# Patient Record
Sex: Female | Born: 1935
Health system: Southern US, Community
[De-identification: ages and names within clinical notes are randomized; demographics above are authoritative.]

## PROBLEM LIST (undated history)

## (undated) DIAGNOSIS — R011 Cardiac murmur, unspecified: Secondary | ICD-10-CM

## (undated) DIAGNOSIS — I10 Essential (primary) hypertension: Secondary | ICD-10-CM

## (undated) DIAGNOSIS — H04123 Dry eye syndrome of bilateral lacrimal glands: Secondary | ICD-10-CM

## (undated) HISTORY — PX: CATARACT EXTRACTION: SUR2

## (undated) HISTORY — DX: Dry eye syndrome of bilateral lacrimal glands: H04.123

## (undated) HISTORY — PX: EYE SURGERY: SHX253

## (undated) HISTORY — DX: Essential (primary) hypertension: I10

## (undated) HISTORY — PX: HERNIA REPAIR: SHX51

## (undated) HISTORY — PX: ABDOMINAL HYSTERECTOMY: SHX81

## (undated) HISTORY — PX: CHOLECYSTECTOMY: SHX55

## (undated) HISTORY — PX: TONSILLECTOMY: SUR1361

---

## 1998-09-03 ENCOUNTER — Other Ambulatory Visit: Admission: RE | Admit: 1998-09-03 | Discharge: 1998-09-03 | Payer: Self-pay | Admitting: Obstetrics and Gynecology

## 1998-11-05 ENCOUNTER — Encounter (INDEPENDENT_AMBULATORY_CARE_PROVIDER_SITE_OTHER): Payer: Self-pay

## 1998-11-05 ENCOUNTER — Other Ambulatory Visit: Admission: RE | Admit: 1998-11-05 | Discharge: 1998-11-05 | Payer: Self-pay | Admitting: Obstetrics and Gynecology

## 1999-01-28 ENCOUNTER — Encounter: Payer: Self-pay | Admitting: General Surgery

## 1999-01-30 ENCOUNTER — Ambulatory Visit (HOSPITAL_COMMUNITY): Admission: RE | Admit: 1999-01-30 | Discharge: 1999-01-31 | Payer: Self-pay | Admitting: General Surgery

## 1999-06-18 ENCOUNTER — Encounter: Payer: Self-pay | Admitting: Family Medicine

## 1999-06-18 ENCOUNTER — Ambulatory Visit (HOSPITAL_COMMUNITY): Admission: RE | Admit: 1999-06-18 | Discharge: 1999-06-18 | Payer: Self-pay | Admitting: Family Medicine

## 1999-11-13 ENCOUNTER — Emergency Department (HOSPITAL_COMMUNITY): Admission: EM | Admit: 1999-11-13 | Discharge: 1999-11-13 | Payer: Self-pay

## 2000-07-21 ENCOUNTER — Other Ambulatory Visit: Admission: RE | Admit: 2000-07-21 | Discharge: 2000-07-21 | Payer: Self-pay | Admitting: Obstetrics and Gynecology

## 2001-12-20 ENCOUNTER — Encounter: Payer: Self-pay | Admitting: Ophthalmology

## 2001-12-23 ENCOUNTER — Ambulatory Visit (HOSPITAL_COMMUNITY): Admission: RE | Admit: 2001-12-23 | Discharge: 2001-12-23 | Payer: Self-pay | Admitting: Ophthalmology

## 2003-08-01 ENCOUNTER — Other Ambulatory Visit: Admission: RE | Admit: 2003-08-01 | Discharge: 2003-08-01 | Payer: Self-pay | Admitting: Obstetrics and Gynecology

## 2003-09-07 ENCOUNTER — Ambulatory Visit (HOSPITAL_COMMUNITY): Admission: RE | Admit: 2003-09-07 | Discharge: 2003-09-07 | Payer: Self-pay | Admitting: Family Medicine

## 2003-09-19 ENCOUNTER — Encounter: Admission: RE | Admit: 2003-09-19 | Discharge: 2003-09-19 | Payer: Self-pay | Admitting: Family Medicine

## 2004-08-22 ENCOUNTER — Encounter: Admission: RE | Admit: 2004-08-22 | Discharge: 2004-08-22 | Payer: Self-pay | Admitting: Obstetrics and Gynecology

## 2004-11-27 ENCOUNTER — Encounter: Admission: RE | Admit: 2004-11-27 | Discharge: 2004-11-27 | Payer: Self-pay | Admitting: General Surgery

## 2004-12-02 ENCOUNTER — Ambulatory Visit (HOSPITAL_BASED_OUTPATIENT_CLINIC_OR_DEPARTMENT_OTHER): Admission: RE | Admit: 2004-12-02 | Discharge: 2004-12-02 | Payer: Self-pay | Admitting: General Surgery

## 2004-12-02 ENCOUNTER — Ambulatory Visit (HOSPITAL_COMMUNITY): Admission: RE | Admit: 2004-12-02 | Discharge: 2004-12-02 | Payer: Self-pay | Admitting: General Surgery

## 2005-10-23 ENCOUNTER — Encounter: Admission: RE | Admit: 2005-10-23 | Discharge: 2005-10-23 | Payer: Self-pay | Admitting: Obstetrics and Gynecology

## 2006-02-11 ENCOUNTER — Encounter: Admission: RE | Admit: 2006-02-11 | Discharge: 2006-02-11 | Payer: Self-pay | Admitting: General Surgery

## 2006-05-04 ENCOUNTER — Encounter: Admission: RE | Admit: 2006-05-04 | Discharge: 2006-05-04 | Payer: Self-pay | Admitting: General Surgery

## 2006-05-07 ENCOUNTER — Ambulatory Visit (HOSPITAL_BASED_OUTPATIENT_CLINIC_OR_DEPARTMENT_OTHER): Admission: RE | Admit: 2006-05-07 | Discharge: 2006-05-07 | Payer: Self-pay | Admitting: General Surgery

## 2006-05-07 ENCOUNTER — Encounter (INDEPENDENT_AMBULATORY_CARE_PROVIDER_SITE_OTHER): Payer: Self-pay | Admitting: Specialist

## 2006-08-12 ENCOUNTER — Other Ambulatory Visit: Admission: RE | Admit: 2006-08-12 | Discharge: 2006-08-12 | Payer: Self-pay | Admitting: Obstetrics and Gynecology

## 2007-02-25 ENCOUNTER — Encounter: Admission: RE | Admit: 2007-02-25 | Discharge: 2007-02-25 | Payer: Self-pay | Admitting: Obstetrics and Gynecology

## 2007-10-25 ENCOUNTER — Encounter: Admission: RE | Admit: 2007-10-25 | Discharge: 2007-10-25 | Payer: Self-pay | Admitting: Gastroenterology

## 2008-05-16 ENCOUNTER — Encounter: Admission: RE | Admit: 2008-05-16 | Discharge: 2008-05-16 | Payer: Self-pay | Admitting: Family Medicine

## 2009-06-05 ENCOUNTER — Encounter: Admission: RE | Admit: 2009-06-05 | Discharge: 2009-06-05 | Payer: Self-pay | Admitting: Family Medicine

## 2010-04-30 ENCOUNTER — Emergency Department (HOSPITAL_COMMUNITY)
Admission: EM | Admit: 2010-04-30 | Discharge: 2010-04-30 | Payer: Self-pay | Source: Home / Self Care | Admitting: Family Medicine

## 2010-05-05 ENCOUNTER — Encounter: Payer: Self-pay | Admitting: Obstetrics and Gynecology

## 2010-06-06 ENCOUNTER — Other Ambulatory Visit: Payer: Self-pay | Admitting: Family Medicine

## 2010-06-06 DIAGNOSIS — Z1231 Encounter for screening mammogram for malignant neoplasm of breast: Secondary | ICD-10-CM

## 2010-06-14 ENCOUNTER — Ambulatory Visit
Admission: RE | Admit: 2010-06-14 | Discharge: 2010-06-14 | Disposition: A | Discharge status: Home / Self Care | Payer: Medicare Other | Source: Ambulatory Visit | Attending: Family Medicine | Admitting: Family Medicine

## 2010-06-14 DIAGNOSIS — Z1231 Encounter for screening mammogram for malignant neoplasm of breast: Secondary | ICD-10-CM

## 2010-07-08 ENCOUNTER — Inpatient Hospital Stay (INDEPENDENT_AMBULATORY_CARE_PROVIDER_SITE_OTHER)
Admission: RE | Admit: 2010-07-08 | Discharge: 2010-07-08 | Disposition: A | Discharge status: Home / Self Care | Payer: Medicare Other | Source: Ambulatory Visit

## 2010-07-08 DIAGNOSIS — R51 Headache: Secondary | ICD-10-CM

## 2010-07-08 DIAGNOSIS — M542 Cervicalgia: Secondary | ICD-10-CM

## 2010-08-30 NOTE — Op Note (Signed)
NAME:  [name] [name]                          ACCOUNT NO.:  [id number]   MEDICAL RECORD NO.:  [id number]                   PATIENT TYPE:  OIB   LOCATION:  2894                                 FACILITY:  [facility]   PHYSICIAN:  Marya Landry. Carlyle Lipa., M.D.      DATE OF BIRTH:  Jul 30, 1931   DATE OF PROCEDURE:  01/14/2002  DATE OF DISCHARGE:  12/23/2001                                 OPERATIVE REPORT   PREOPERATIVE DIAGNOSIS:  Immature cataract, left eye.   POSTOPERATIVE DIAGNOSIS:  Immature cataract, left eye.   OPERATION:  Extracapsular cataract extraction with intraocular lens  implantation.   SURGEON:  Marya Landry. [name], M.D.   ANESTHESIA:  Local using Xylocaine 2% with Marcaine and 0.75% W[place]ase.   JUSTIFICATION FOR PROCEDURE:  This is a 77 year old lady, who complains of  the inability to see from the left eye.  She has been followed for some time  for progressive cataract formation.  Now, the cataract has progressed to a  point that there is no view into the fundus.  She was evaluated and found to  have a visual acuity correctable to 20/30 on the right, counts fingers on  the left.  There was a dense 4+ nuclear sclerotic cataract of the left eye.  Cataract extraction with intraocular lens implantation was recommended.  She  is admitted at this time to undergo the procedure.   DESCRIPTION OF PROCEDURE:  Under influence of IV sedation, Van Lint akinesia  and retrobulbar anesthesia were given.  The patient was prepped and draped  in the usual manner.  The lid speculum was inserted under the upper and  lower lids of the left eye and four silk traction sutures were passed  through the belly of the superior rectus muscle for traction.  A 4 inch  length conjunctival flap was turned and hemostasis achieved with using  cautery.  A groove incision was made around the limbus for 120 degrees.  The  anterior chamber was then entered through this groove incision at the 11:00  o'clock  position using a Superblade.  OcuCoat was injected into the eye and  an anterior capsulotomy was done using a bent 25 gauge needle.  The  corneoscleral wound was then extended first toward the left, then toward the  right, placing a single 8-0 Vicryl suture across each arm of the incision,  as it was made ___________ toward the left and toward the right.  The  nucleus was then manually expressed from the eye.  The two previously placed  sutures were closed and a third one was placed at the 12:00 o'clock  position.  The eye handpiece was passed into the eye and the residual  cortical material was aspirated.  The posterior capsule was polished using  the olive tip polisher.  The wound was widened slightly to accommodate a  posterior chamber lens.  This lens was seated into the eye behind the iris  without difficulty.  The anterior chamber was reformed and pupil was  ___________ using Miochol.  The corneoscleral wound was closed by using a  combination of interrupted sutures of 8-0 Vicryl and 10-0 nylon.  After it  was ascertained that the wound was air tight and water tight, the  conjunctiva was closed using thermal cautery.  1 cc of Celestone and a 0.50  cc of Gentamicin were injected subconjunctivally.  Maxitrol Ophthalmic  ointment and Pilocarpine ointment were applied, along with a patch and Fox  shield.  The patient tolerated the procedure well and was discharged to Post-  Anesthesia Recovery in satisfactory  condition.  She was instructed to rest today, take Vicodin every four hours  as needed for pain and to see me in my office tomorrow for further  evaluation.   DISCHARGE DIAGNOSIS:  Immature cataract, left eye.                                               Marya Landry. Carlyle Lipa., M.D.    TEB/MEDQ  D:  01/14/2002  T:  01/17/2002  Job:  [id number]

## 2010-08-30 NOTE — Op Note (Signed)
NAME:  [name] [name], [name] [name]              ACCOUNT NO.:  [id number]   MEDICAL RECORD NO.:  [id number]          PATIENT TYPE:  AMB   LOCATION:  DSC                          FACILITY:  [facility]   PHYSICIAN:  Cherylynn Ridges, M.D.    DATE OF BIRTH:  15-Feb-1932   DATE OF PROCEDURE:  12/02/2004  DATE OF DISCHARGE:                                 OPERATIVE REPORT   PREOPERATIVE DIAGNOSIS:  Right inguinal hernia.   POSTOPERATIVE DIAGNOSIS:  Right indirect inguinal hernia.   PROCEDURE:  Bassini-type right inguinal hernia repair without mesh.   SURGEON:  Dr. Lindie Spruce.   ASSISTANT:  No assistant.   ANESTHESIA:  General with a laryngeal airway.   ESTIMATED BLOOD LOSS:  Less than 20 cc.   COMPLICATIONS:  None.   CONDITION:  Stable.   INDICATIONS FOR OPERATION:  The patient is a 77 year old female with  symptomatic right inguinal hernia who comes in now for repair.   FINDINGS:  The patient had an indirect hernia associated with lipoma of the  right inguinal area.   OPERATION:  The patient was taken to the operating room and placed on the  table in supine position. After an adequate general laryngeal airway  anesthetic was administered, she was prepped and draped in the usual sterile  manner, exposing the right groin.   The transverse incision about 4-1/2 to 5 cm long was made at the superficial  ring level using a #10 blade and taken down to the external oblique fashion  with cautery. We cauterized the bleeding and the subcutaneous tissue. A  hernia could be seen bulging through the superficial ring, and we opened the  ring through the external oblique fibers along the fibers, exposing the  ligamentum teres. The round ligament was mobilized using a Penrose drain. We  subsequently dissected away the hernia sac which was anterior medially,  opened it to ensure there was no entrapped contents, and ligated it out at  the base after twisting it at the internal ring using 2-0 Ethibond suture  ligatures.  Once these were in place, we actually repaired the floor using  interrupted 0 Ethibond sutures attaching the conjoined tendon down to the  refracted portion of inguinal ligament without much tension. Once this was  done, we closed the external oblique fascia on top of the round ligament  using a running 3-0 Vicryl. Then, subcu Scarpa's fascia was  reapproximated using interrupted 0 Vicryl. Then, the skin was closed using a  running subcuticular suture of 4-0 Monocryl. We irrigated with saline and  injected with 1/2% Marcaine without epinephrine. Sterile dressing was  applied.      Cherylynn Ridges, M.D.  Electronically Signed     JOW/MEDQ  D:  12/02/2004  T:  12/02/2004  Job:  [id number]

## 2010-08-30 NOTE — Op Note (Signed)
NAME:  [name] [name], [name] [name]              ACCOUNT NO.:  [id number]   MEDICAL RECORD NO.:  [id number]          PATIENT TYPE:  AMB   LOCATION:  DSC                          FACILITY:  [facility]   PHYSICIAN:  Cherylynn Ridges, M.D.    DATE OF BIRTH:  [birth year]   DATE OF PROCEDURE:  05/07/2006  DATE OF DISCHARGE:                               OPERATIVE REPORT   PREOPERATIVE DIAGNOSIS:  Left femoral hernia.   POSTOPERATIVE DIAGNOSIS:  Left femoral hernia.   PROCEDURE:  Left femoral hernia repair, Cooper/McVay type without mesh.   SURGEON:  Cherylynn Ridges, M.D.   ANESTHESIA:  General with laryngeal airway.   ESTIMATED BLOOD LOSS:  Less than 20 mL.   COMPLICATIONS:  None.   CONDITION:  Stable.   SPECIMEN:  Left inguinal lymph node was sent.   INDICATIONS FOR OPERATION:  The patient is a 77 year old with a  symptomatic left femoral hernia noted on CT scan and now comes in for  repair.   FINDINGS:  The patient had a small direct femoral defect, which was  going down just medial to the femoral vein on the left side.   OPERATION:  The patient was taken to the operating room and placed on  the table in the supine position.  After an adequate general laryngeal  anesthetic was administered, she was prepped and draped in the usual  sterile manner exposing the left inguinal area.   The patient had had a previous left inguinal hernia repair.  A left  transverse curvilinear incision was made about 5 cm long down into  subcutaneous tissue.  We then dissected down through Scarpa fascia down  to the external oblique fascia, which had some scarring around it.  However, we were able to identify the inferior fascial edge distal to  what would have been a superficial ring.  We opened the fascia down  through the superficial ring and then identified the edge of the  conjoined tendon.  However, no round ligament was noted on the left  side.  There was a branch of the iliohypogastric nerve which went  directly into the area of repair and we transected it at near what would  be the deep ring.  This was done with cautery with no ligatures in that  area.   We identified the left femoral hernia going down medial to the left  femoral vein.  We were able to dissect it free and then subsequently  imbricated to itself using the transversalis fascia and 0 Ethibond  sutures.  Once this was done, a Cooper/McVay type repair was done and  taking the conjoined tendon down to the Cooper ligament using  interrupted 0 Ethibond sutures.  We stopped this at the place just  medial to the left femoral vein and made a transition stitch to the  reflective portion of the inguinal ligament using the same 0 Ethibond  suture.  Lateral stitches were placed, but care was taken not to impinge  upon the left femoral vein.   Once this was completed, we irrigated with saline solution.  We closed  the external  oblique fascia using running 3-0 Vicryl and then Scarpa  fascia was reapproximated using interrupted 3-0 Vicryl.  Then we  injected 20 mL of 0.5% Marcaine without epinephrine into the  subcutaneous with a block at the anterior superior iliac spine.  The  skin was closed using running subcuticular stitch of 4-0 Monocryl.  A  sterile dressing was applied.  All counts were correct, including  needle, sponges, and instruments.      Cherylynn Ridges, M.D.  Electronically Signed     JOW/MEDQ  D:  05/07/2006  T:  05/07/2006  Job:  [id number]   cc:   Primary Care Physician

## 2010-10-07 ENCOUNTER — Other Ambulatory Visit: Payer: Self-pay | Admitting: Family Medicine

## 2010-10-07 DIAGNOSIS — K862 Cyst of pancreas: Secondary | ICD-10-CM

## 2010-10-10 ENCOUNTER — Ambulatory Visit
Admission: RE | Admit: 2010-10-10 | Discharge: 2010-10-10 | Disposition: A | Discharge status: Home / Self Care | Payer: Medicare Other | Source: Ambulatory Visit | Attending: Family Medicine | Admitting: Family Medicine

## 2010-10-10 DIAGNOSIS — K862 Cyst of pancreas: Secondary | ICD-10-CM

## 2010-10-10 DIAGNOSIS — K863 Pseudocyst of pancreas: Secondary | ICD-10-CM

## 2010-10-10 MED ORDER — IOHEXOL 300 MG/ML  SOLN: 100.0000 mL | Freq: Once | INTRAMUSCULAR | Status: AC | PRN

## 2011-09-19 ENCOUNTER — Other Ambulatory Visit: Payer: Self-pay | Admitting: Family Medicine

## 2011-10-09 ENCOUNTER — Other Ambulatory Visit: Payer: Self-pay | Admitting: Family Medicine

## 2011-10-09 DIAGNOSIS — Z1231 Encounter for screening mammogram for malignant neoplasm of breast: Secondary | ICD-10-CM

## 2011-10-10 ENCOUNTER — Ambulatory Visit
Admission: RE | Admit: 2011-10-10 | Discharge: 2011-10-10 | Disposition: A | Discharge status: Home / Self Care | Payer: Medicare Other | Source: Ambulatory Visit | Attending: Family Medicine | Admitting: Family Medicine

## 2011-10-10 DIAGNOSIS — Z1231 Encounter for screening mammogram for malignant neoplasm of breast: Secondary | ICD-10-CM

## 2013-04-01 ENCOUNTER — Other Ambulatory Visit: Payer: Self-pay

## 2013-04-01 DIAGNOSIS — Z1231 Encounter for screening mammogram for malignant neoplasm of breast: Secondary | ICD-10-CM

## 2013-04-22 ENCOUNTER — Ambulatory Visit: Payer: Self-pay

## 2013-05-03 ENCOUNTER — Ambulatory Visit (INDEPENDENT_AMBULATORY_CARE_PROVIDER_SITE_OTHER): Payer: Medicare HMO

## 2013-05-03 DIAGNOSIS — M79673 Pain in unspecified foot: Secondary | ICD-10-CM

## 2013-05-03 DIAGNOSIS — B351 Tinea unguium: Secondary | ICD-10-CM

## 2013-05-03 DIAGNOSIS — M79609 Pain in unspecified limb: Secondary | ICD-10-CM

## 2013-05-03 DIAGNOSIS — Q828 Other specified congenital malformations of skin: Secondary | ICD-10-CM

## 2013-05-03 DIAGNOSIS — M1990 Unspecified osteoarthritis, unspecified site: Secondary | ICD-10-CM

## 2013-05-03 DIAGNOSIS — Q6650 Congenital pes planus, unspecified foot: Secondary | ICD-10-CM

## 2013-05-03 NOTE — Progress Notes (Signed)
   Subjective:    Patient ID: [name] [name], female    DOB: 06/20/1931, 80 y.o.   MRN: [id number]  HPI Comments: "I have flat feet"  Patient states that she has had flat feet for several years. She has callused areas that have become painful when walking. She uses Vaseline and gets pedicure to shave monthly. Getting worse and uncomfortable.  patient has severe arthropathy associated with pes planus/has valgus deformity collapse of the medial arch left more so than right with Charcot like changes of both feet. His plantar prominence of bone structures of the first met cuneiform joint plantarly on bilateral feet with associated keratoses. Patient been doing palliative care in the past with temporary relief is wearing depth shoes with Plastizote inlays to accommodate lesions.    Review of Systems  Musculoskeletal: Positive for gait problem.  All other systems reviewed and are negative.       Objective:   Physical Exam Vascular status is intact as follows pedal pulses palpable DP postal for PT thready one over 4 bilateral Refill time 3 seconds all digits. Neurologically epicritic and proprioceptive sensations intact there is normal plantar response DTRs not elicited dermatologically nail somewhat criptotic incurvated and friable tender on palpation and with enclosed shoe wear one through 5 bilateral. There is also multiple keratoses sub-first met cuneiform base bilateral secondary to collapse in the medial column bilateral arches there is also keratoses HD 5 bilateral interdigitally between the first and second digits right foot to hammertoe and HAV deformity. X-rays demonstrate severe arthropathy Lisfranc's and mid tarsus area left foot more so than right with someone Charcot like changes on the left foot. We discussed options patient candidate for surgical intervention based on age in symptomology and osteopenic changes at this time. Recommendation is for accommodative therapy with new Plastizote  inlays being dispensed at this time. The fit and contour well to the patient's foot while for break-in period and adjustments over time. Maintain accommodative shoes she is currently wearing.       Assessment & Plan:  Assessment severe arthropathy with Charcot like changes of the feet left more so than right will maintain accommodative therapy with Plastizote accommodative cushioned insoles memory foam like insoles. These are dispensed for patient use at this time. Also debridement nails painful mycotic friable nails 1 through 5 bilateral and debridement multiple keratoses HD 5 bilateral sub-first metatarsal base bilateral and interdigitally between first and second right. Maintain cushion shoes and insoles followup in the future and as-needed basis for palliative care is needed contact us visiting changes or exacerbations in the interim.  Harriet Masson DPM

## 2013-05-03 NOTE — Patient Instructions (Signed)

## 2013-05-05 ENCOUNTER — Ambulatory Visit
Admission: RE | Admit: 2013-05-05 | Discharge: 2013-05-05 | Disposition: A | Discharge status: Home / Self Care | Payer: Commercial Managed Care - HMO | Source: Ambulatory Visit

## 2013-05-05 DIAGNOSIS — Z1231 Encounter for screening mammogram for malignant neoplasm of breast: Secondary | ICD-10-CM

## 2013-05-06 ENCOUNTER — Other Ambulatory Visit: Payer: Self-pay | Admitting: Family Medicine

## 2013-05-06 DIAGNOSIS — R928 Other abnormal and inconclusive findings on diagnostic imaging of breast: Secondary | ICD-10-CM

## 2013-05-17 ENCOUNTER — Other Ambulatory Visit: Payer: Self-pay | Admitting: Family Medicine

## 2013-05-17 ENCOUNTER — Ambulatory Visit
Admission: RE | Admit: 2013-05-17 | Discharge: 2013-05-17 | Disposition: A | Discharge status: Home / Self Care | Payer: Commercial Managed Care - HMO | Source: Ambulatory Visit | Attending: Family Medicine | Admitting: Family Medicine

## 2013-05-17 DIAGNOSIS — R928 Other abnormal and inconclusive findings on diagnostic imaging of breast: Secondary | ICD-10-CM

## 2013-05-20 ENCOUNTER — Ambulatory Visit
Admission: RE | Admit: 2013-05-20 | Discharge: 2013-05-20 | Disposition: A | Discharge status: Home / Self Care | Payer: Commercial Managed Care - HMO | Source: Ambulatory Visit | Attending: Family Medicine | Admitting: Family Medicine

## 2013-05-20 ENCOUNTER — Other Ambulatory Visit: Payer: Self-pay | Admitting: Family Medicine

## 2013-05-20 DIAGNOSIS — R921 Mammographic calcification found on diagnostic imaging of breast: Secondary | ICD-10-CM

## 2013-05-20 DIAGNOSIS — R928 Other abnormal and inconclusive findings on diagnostic imaging of breast: Secondary | ICD-10-CM

## 2013-05-20 HISTORY — PX: BREAST BIOPSY: SHX20

## 2013-12-27 ENCOUNTER — Ambulatory Visit (INDEPENDENT_AMBULATORY_CARE_PROVIDER_SITE_OTHER): Payer: Medicare HMO

## 2013-12-27 DIAGNOSIS — B351 Tinea unguium: Secondary | ICD-10-CM

## 2013-12-27 DIAGNOSIS — M19079 Primary osteoarthritis, unspecified ankle and foot: Secondary | ICD-10-CM

## 2013-12-27 DIAGNOSIS — Q828 Other specified congenital malformations of skin: Secondary | ICD-10-CM

## 2013-12-27 DIAGNOSIS — M79609 Pain in unspecified limb: Secondary | ICD-10-CM

## 2013-12-27 DIAGNOSIS — M79673 Pain in unspecified foot: Secondary | ICD-10-CM

## 2013-12-27 NOTE — Patient Instructions (Signed)

## 2013-12-27 NOTE — Progress Notes (Signed)
   Subjective:    Patient ID: [name] [name], female    DOB: June 09, 1931, 80 y.o.   MRN: [id number]  HPI Comments: Pt presents for debridement of 10 toenails and callouses to the medial plantar midfoot.     Review of Systems no new findings or systemic changes no     Objective:   Physical Exam  80 year old female to this time for followup continues have multiple callus series of both feet Below to the medial navicular cuneiform joint the bilateral foot also sub-first MTP area right in HD 5 right. Patient does have thick brittle dystrophic criptotic nails with discoloration and friability 1 through 4 bilateral patient also has some initial maceration fourth webspace left foot which is treated with Candee Furbish paint. Neurovascular status is intact and unchanged pedal pulses are palpable DP +2/4 PT one over 4 bilateral capillary refill time 3 seconds epicritic and proprioceptive sensations intact and symmetric orthopedic exam reveals arthritic changes of the ankle subtalar mid tarsus with continued Charcot like changes of the foot with collapse of the medial arch of the naviculocuneiform and talonavicular joint lateral deviation of the forefoot no current open wounds or ulcers are noted no secondary infection     Assessment & Plan:  assessment this time is onychomycosis painful mycotic nails 1 through 4 bilateral debridement at this time also multiple keratoses debridement presence of osteo- arthropathy and deformity with Charcot like changes of the foot keratosis of first met cuneiform base as well as HD 5 right are all debrided at this time multiple nails and keratoses are debrided at today's visit recheck in 3-6 months for an as-needed basis in the future for followup following debridement Neosporin and looking applied to the fifth toe right maintain appropriate accommodative shoes at all times  [name] [name] DPM

## 2013-12-30 ENCOUNTER — Ambulatory Visit (HOSPITAL_COMMUNITY): Payer: Medicare HMO | Attending: Physician Assistant

## 2013-12-30 ENCOUNTER — Emergency Department (HOSPITAL_COMMUNITY)
Admission: EM | Admit: 2013-12-30 | Discharge: 2013-12-30 | Disposition: A | Discharge status: Home / Self Care | Payer: Medicare HMO | Source: Home / Self Care | Attending: Family Medicine | Admitting: Family Medicine

## 2013-12-30 ENCOUNTER — Encounter (HOSPITAL_COMMUNITY): Payer: Self-pay | Admitting: Emergency Medicine

## 2013-12-30 DIAGNOSIS — M25559 Pain in unspecified hip: Secondary | ICD-10-CM | POA: Diagnosis present

## 2013-12-30 DIAGNOSIS — M5430 Sciatica, unspecified side: Secondary | ICD-10-CM

## 2013-12-30 DIAGNOSIS — M5431 Sciatica, right side: Secondary | ICD-10-CM

## 2013-12-30 LAB — POCT URINALYSIS DIP (DEVICE)
BILIRUBIN URINE: NEGATIVE
Glucose, UA: NEGATIVE mg/dL
Hgb urine dipstick: NEGATIVE
Ketones, ur: NEGATIVE mg/dL
Nitrite: NEGATIVE
PH: 7 (ref 5.0–8.0)
PROTEIN: NEGATIVE mg/dL
SPECIFIC GRAVITY, URINE: 1.01 (ref 1.005–1.030)
Urobilinogen, UA: 0.2 mg/dL (ref 0.0–1.0)

## 2013-12-30 MED ORDER — MELOXICAM 7.5 MG PO TABS: 7.5000 mg | ORAL_TABLET | Freq: Every day | ORAL | Status: DC

## 2013-12-30 NOTE — ED Provider Notes (Signed)
CSN: [id number]     Arrival date & time 12/30/13  1211 History   First MD Initiated Contact with Patient 12/30/13 1258     Chief Complaint  Patient presents with  . Hip Pain   (Consider location/radiation/quality/duration/timing/severity/associated sxs/prior Treatment) HPI Comments: Patient presents with 3 weeks of right hip pain. Cannot recall any specific injury and she does report some relief with the use of tylenol. Reports herself to be otherwise healthy. Denies previous injury or surgery. Patient reports that she has been told that she has OA in several of her other joints and wonders if this is also the case with her hip. Reports that she took a tylenol prior to arrival and she is feeling well at the time of exam and is without symptom.  PCP: Dr. Massie Maroon   Patient is a 80 y.o. female presenting with hip pain. The history is provided by the patient.  Hip Pain This is a new problem. Episode onset: began 3 weeks ago.    Past Medical History  Diagnosis Date  . Dry eyes   . Hypertension    Past Surgical History  Procedure Laterality Date  . Abdominal hysterectomy    . Tonsillectomy    . Hernia repair    . Eye surgery    . Cholecystectomy    . Cataract extraction     No family history on file. History  Substance Use Topics  . Smoking status: Never Smoker   . Smokeless tobacco: Not on file  . Alcohol Use: No   OB History   Grav Para Term Preterm Abortions TAB SAB Ect Mult Living                 Review of Systems  All other systems reviewed and are negative.   Allergies  Actonel  Home Medications   Prior to Admission medications   Medication Sig Start Date End Date Taking? Authorizing Provider  Artificial Tear Ointment (ARTIFICIAL TEARS) ointment as needed.    Historical Provider, MD  Cholecalciferol (VITAMIN D PO) Take by mouth.    Historical Provider, MD  hydrochlorothiazide (MICROZIDE) 12.5 MG capsule Take 12.5 mg by mouth daily.    Historical Provider, MD   meloxicam (MOBIC) 7.5 MG tablet Take 1 tablet (7.5 mg total) by mouth daily. Take once a day as needed for hip pain. 12/30/13   Mathis Fare Mahoganie Basher, PA  PREDNISONE PO by Other route. Eyedrops-left eye    Historical Provider, MD  psyllium (METAMUCIL) 58.6 % packet Take 1 packet by mouth daily.    Historical Provider, MD   BP 159/80  Pulse 74  Temp(Src) 98 F (36.7 C) (Oral)  Resp 16  SpO2 96% Physical Exam  Nursing note and vitals reviewed. Constitutional: She is oriented to person, place, and time. She appears well-developed and well-nourished. No distress.  HENT:  Head: Normocephalic and atraumatic.  Eyes: Conjunctivae are normal.  Cardiovascular: Normal rate, regular rhythm and normal heart sounds.   Pulmonary/Chest: Effort normal and breath sounds normal.  Abdominal: Soft. Bowel sounds are normal. She exhibits no distension. There is no tenderness.  Musculoskeletal: Normal range of motion.       Right hip: She exhibits normal range of motion, normal strength, no tenderness, no bony tenderness, no swelling, no crepitus, no deformity and no laceration.       Legs: Outlined areas are areas of right hip discomfort when symptoms occur.   Neurological: She is alert and oriented to person, place, and time.  Skin: Skin is warm and dry.  Psychiatric: She has a normal mood and affect. Her behavior is normal.    ED Course  Procedures (including critical care time) Labs Review Labs Reviewed  POCT URINALYSIS DIP (DEVICE) - Abnormal; Notable for the following:    Leukocytes, UA SMALL (*)    All other components within normal limits    Imaging Review Dg Hip Complete Right  12/30/2013   CLINICAL DATA:  Pain  EXAM: RIGHT HIP - COMPLETE 2+ VIEW  COMPARISON:  None.  FINDINGS: Frontal pelvis as well as frontal and lateral right hip images were obtained. There is no fracture or dislocation. There is slight symmetric narrowing of both hip joints. No erosive change.  IMPRESSION: Slight  symmetric narrowing of both hip joints. No fracture or dislocation.   Electronically Signed   By: Bretta Bang M.D.   On: 12/30/2013 14:30     MDM   1. Sciatica, right    UA unremarkable Films only notable for symmetric narrowing of both hip joints. I suspect this patient is having mild intermittent sciatica and perhaps mild discomfort from degenerative changes at hips. Will provide patient with Rx for Mobic 7.5mg  to be used once daily as needed for hip discomfort and advise PCP follow up if symptoms persist.     Ria Clock, PA 12/30/13 [phone]

## 2013-12-30 NOTE — ED Provider Notes (Signed)
Medical screening examination/treatment/procedure(s) were performed by resident physician or non-physician practitioner and as supervising physician I was immediately available for consultation/collaboration.   Michele Judy DOUGLAS MD.   Harris Kistler D Sahra Converse, MD 12/30/13 1651 

## 2013-12-30 NOTE — Discharge Instructions (Signed)
The xrays of your right hip and pelvis were normal and only with mild degenerative changes in both hip joints. Your urine studies were normal. I suspect this is simply mild arthritis type pain and I have prescribed a medication that you can use once a day on the days that your are having discomfort. If symptom do not improve or become worse, please follow up with your primary care doctor.  Sciatica Sciatica is pain, weakness, numbness, or tingling along the path of the sciatic nerve. The nerve starts in the lower back and runs down the back of each leg. The nerve controls the muscles in the lower leg and in the back of the knee, while also providing sensation to the back of the thigh, lower leg, and the sole of your foot. Sciatica is a symptom of another medical condition. For instance, nerve damage or certain conditions, such as a herniated disk or bone spur on the spine, pinch or put pressure on the sciatic nerve. This causes the pain, weakness, or other sensations normally associated with sciatica. Generally, sciatica only affects one side of the body. CAUSES   Herniated or slipped disc.  Degenerative disk disease.  A pain disorder involving the narrow muscle in the buttocks (piriformis syndrome).  Pelvic injury or fracture.  Pregnancy.  Tumor (rare). SYMPTOMS  Symptoms can vary from mild to very severe. The symptoms usually travel from the low back to the buttocks and down the back of the leg. Symptoms can include:  Mild tingling or dull aches in the lower back, leg, or hip.  Numbness in the back of the calf or sole of the foot.  Burning sensations in the lower back, leg, or hip.  Sharp pains in the lower back, leg, or hip.  Leg weakness.  Severe back pain inhibiting movement. These symptoms may get worse with coughing, sneezing, laughing, or prolonged sitting or standing. Also, being overweight may worsen symptoms. DIAGNOSIS  Your caregiver will perform a physical exam to look  for common symptoms of sciatica. He or she may ask you to do certain movements or activities that would trigger sciatic nerve pain. Other tests may be performed to find the cause of the sciatica. These may include:  Blood tests.  X-rays.  Imaging tests, such as an MRI or CT scan. TREATMENT  Treatment is directed at the cause of the sciatic pain. Sometimes, treatment is not necessary and the pain and discomfort goes away on its own. If treatment is needed, your caregiver may suggest:  Over-the-counter medicines to relieve pain.  Prescription medicines, such as anti-inflammatory medicine, muscle relaxants, or narcotics.  Applying heat or ice to the painful area.  Steroid injections to lessen pain, irritation, and inflammation around the nerve.  Reducing activity during periods of pain.  Exercising and stretching to strengthen your abdomen and improve flexibility of your spine. Your caregiver may suggest losing weight if the extra weight makes the back pain worse.  Physical therapy.  Surgery to eliminate what is pressing or pinching the nerve, such as a bone spur or part of a herniated disk. HOME CARE INSTRUCTIONS   Only take over-the-counter or prescription medicines for pain or discomfort as directed by your caregiver.  Apply ice to the affected area for 20 minutes, 3-4 times a day for the first 48-72 hours. Then try heat in the same way.  Exercise, stretch, or perform your usual activities if these do not aggravate your pain.  Attend physical therapy sessions as directed by your  caregiver.  Keep all follow-up appointments as directed by your caregiver.  Do not wear high heels or shoes that do not provide proper support.  Check your mattress to see if it is too soft. A firm mattress may lessen your pain and discomfort. SEEK IMMEDIATE MEDICAL CARE IF:   You lose control of your bowel or bladder (incontinence).  You have increasing weakness in the lower back, pelvis,  buttocks, or legs.  You have redness or swelling of your back.  You have a burning sensation when you urinate.  You have pain that gets worse when you lie down or awakens you at night.  Your pain is worse than you have experienced in the past.  Your pain is lasting longer than 4 weeks.  You are suddenly losing weight without reason. MAKE SURE YOU:  Understand these instructions.  Will watch your condition.  Will get help right away if you are not doing well or get worse. Document Released: 03/25/2001 Document Revised: 09/30/2011 Document Reviewed: 08/10/2011 [facility] Patient Information 2015 [place], [place]. This information is not intended to replace advice given to you by your health care provider. Make sure you discuss any questions you have with your health care provider.

## 2013-12-30 NOTE — ED Notes (Signed)
Patient c/o right side hip pain x 3 weeks. Patient reports last week she had bilateral hip pain which caused her to fall. Patient reports she has taken Tylenol with mild relief. Patient is alert and oriented and in no acute distress.

## 2014-01-06 ENCOUNTER — Other Ambulatory Visit: Payer: Self-pay | Admitting: Family Medicine

## 2014-01-06 DIAGNOSIS — H3401 Transient retinal artery occlusion, right eye: Secondary | ICD-10-CM

## 2014-01-12 ENCOUNTER — Ambulatory Visit
Admission: RE | Admit: 2014-01-12 | Discharge: 2014-01-12 | Disposition: A | Discharge status: Home / Self Care | Payer: Commercial Managed Care - HMO | Source: Ambulatory Visit | Attending: Family Medicine | Admitting: Family Medicine

## 2014-01-12 DIAGNOSIS — H3401 Transient retinal artery occlusion, right eye: Secondary | ICD-10-CM

## 2014-04-15 DIAGNOSIS — H2511 Age-related nuclear cataract, right eye: Secondary | ICD-10-CM | POA: Diagnosis not present

## 2014-04-15 DIAGNOSIS — H31012 Macula scars of posterior pole (postinflammatory) (post-traumatic), left eye: Secondary | ICD-10-CM | POA: Diagnosis not present

## 2014-04-28 ENCOUNTER — Other Ambulatory Visit: Payer: Self-pay

## 2014-04-28 DIAGNOSIS — Z1231 Encounter for screening mammogram for malignant neoplasm of breast: Secondary | ICD-10-CM

## 2014-05-08 ENCOUNTER — Ambulatory Visit
Admission: RE | Admit: 2014-05-08 | Discharge: 2014-05-08 | Disposition: A | Discharge status: Home / Self Care | Payer: Commercial Managed Care - HMO | Source: Ambulatory Visit

## 2014-05-08 DIAGNOSIS — Z1231 Encounter for screening mammogram for malignant neoplasm of breast: Secondary | ICD-10-CM

## 2014-06-22 DIAGNOSIS — G453 Amaurosis fugax: Secondary | ICD-10-CM | POA: Diagnosis not present

## 2014-06-22 DIAGNOSIS — H26492 Other secondary cataract, left eye: Secondary | ICD-10-CM | POA: Diagnosis not present

## 2014-06-22 DIAGNOSIS — H2511 Age-related nuclear cataract, right eye: Secondary | ICD-10-CM | POA: Diagnosis not present

## 2014-06-22 DIAGNOSIS — H25011 Cortical age-related cataract, right eye: Secondary | ICD-10-CM | POA: Diagnosis not present

## 2014-07-06 ENCOUNTER — Ambulatory Visit: Payer: Commercial Managed Care - HMO

## 2014-07-10 DIAGNOSIS — M316 Other giant cell arteritis: Secondary | ICD-10-CM | POA: Diagnosis not present

## 2014-07-10 DIAGNOSIS — H5712 Ocular pain, left eye: Secondary | ICD-10-CM | POA: Diagnosis not present

## 2014-07-17 DIAGNOSIS — H5712 Ocular pain, left eye: Secondary | ICD-10-CM | POA: Diagnosis not present

## 2014-07-31 DIAGNOSIS — G453 Amaurosis fugax: Secondary | ICD-10-CM | POA: Diagnosis not present

## 2014-08-15 DIAGNOSIS — Z0000 Encounter for general adult medical examination without abnormal findings: Secondary | ICD-10-CM | POA: Diagnosis not present

## 2014-08-15 DIAGNOSIS — I10 Essential (primary) hypertension: Secondary | ICD-10-CM | POA: Diagnosis not present

## 2014-08-15 DIAGNOSIS — K219 Gastro-esophageal reflux disease without esophagitis: Secondary | ICD-10-CM | POA: Diagnosis not present

## 2014-08-15 DIAGNOSIS — M810 Age-related osteoporosis without current pathological fracture: Secondary | ICD-10-CM | POA: Diagnosis not present

## 2014-08-22 DIAGNOSIS — H2511 Age-related nuclear cataract, right eye: Secondary | ICD-10-CM | POA: Diagnosis not present

## 2014-08-24 DIAGNOSIS — Z1211 Encounter for screening for malignant neoplasm of colon: Secondary | ICD-10-CM | POA: Diagnosis not present

## 2014-12-28 DIAGNOSIS — H02053 Trichiasis without entropian right eye, unspecified eyelid: Secondary | ICD-10-CM | POA: Diagnosis not present

## 2014-12-28 DIAGNOSIS — H40023 Open angle with borderline findings, high risk, bilateral: Secondary | ICD-10-CM | POA: Diagnosis not present

## 2014-12-30 ENCOUNTER — Encounter (HOSPITAL_COMMUNITY): Payer: Self-pay | Admitting: *Deleted

## 2014-12-30 ENCOUNTER — Emergency Department (HOSPITAL_COMMUNITY)
Admission: EM | Admit: 2014-12-30 | Discharge: 2014-12-30 | Disposition: A | Discharge status: Home / Self Care | Payer: Commercial Managed Care - HMO | Attending: Emergency Medicine | Admitting: Emergency Medicine

## 2014-12-30 DIAGNOSIS — M791 Myalgia: Secondary | ICD-10-CM | POA: Diagnosis not present

## 2014-12-30 DIAGNOSIS — Y929 Unspecified place or not applicable: Secondary | ICD-10-CM | POA: Diagnosis not present

## 2014-12-30 DIAGNOSIS — I10 Essential (primary) hypertension: Secondary | ICD-10-CM | POA: Diagnosis not present

## 2014-12-30 DIAGNOSIS — Y939 Activity, unspecified: Secondary | ICD-10-CM | POA: Diagnosis not present

## 2014-12-30 DIAGNOSIS — Y999 Unspecified external cause status: Secondary | ICD-10-CM | POA: Insufficient documentation

## 2014-12-30 DIAGNOSIS — S61215A Laceration without foreign body of left ring finger without damage to nail, initial encounter: Secondary | ICD-10-CM | POA: Diagnosis not present

## 2014-12-30 DIAGNOSIS — Z79899 Other long term (current) drug therapy: Secondary | ICD-10-CM | POA: Diagnosis not present

## 2014-12-30 DIAGNOSIS — W2209XA Striking against other stationary object, initial encounter: Secondary | ICD-10-CM | POA: Diagnosis not present

## 2014-12-30 DIAGNOSIS — Z8669 Personal history of other diseases of the nervous system and sense organs: Secondary | ICD-10-CM | POA: Diagnosis not present

## 2014-12-30 DIAGNOSIS — S61219A Laceration without foreign body of unspecified finger without damage to nail, initial encounter: Secondary | ICD-10-CM

## 2014-12-30 DIAGNOSIS — Z7952 Long term (current) use of systemic steroids: Secondary | ICD-10-CM | POA: Diagnosis not present

## 2014-12-30 DIAGNOSIS — S6992XA Unspecified injury of left wrist, hand and finger(s), initial encounter: Secondary | ICD-10-CM | POA: Diagnosis present

## 2014-12-30 NOTE — ED Notes (Signed)
Pt closed LT ring finger in a amp.and now has swelling and bruising to LT ring finger

## 2014-12-30 NOTE — ED Provider Notes (Signed)
CSN: [id number]     Arrival date & time 12/30/14  0741 History   First MD Initiated Contact with Patient 12/30/14 0750     Chief Complaint  Patient presents with  . Finger Injury     (Consider location/radiation/quality/duration/timing/severity/associated sxs/prior Treatment) HPI Comments: Patient presents with complaint of left ring finger injury sustained an approximately 7 PM yesterday (see 13 hours ago). Patient states that she "smashed" her finger on an amplifier. She complains of swelling and pain in the left finger. She has a ring that she did not initially take off because she did not think that her finger would swell. Upon awaking this morning, patient noted that she could not remove her ring. Patient applied pressure and cleaned the wound with soap and water after the injury. No other treatments prior to arrival. No other medical complaints.  The history is provided by the patient.    Past Medical History  Diagnosis Date  . Dry eyes   . Hypertension    Past Surgical History  Procedure Laterality Date  . Abdominal hysterectomy    . Tonsillectomy    . Hernia repair    . Eye surgery    . Cholecystectomy    . Cataract extraction     History reviewed. No pertinent family history. Social History  Substance Use Topics  . Smoking status: Never Smoker   . Smokeless tobacco: None  . Alcohol Use: No   OB History    No data available     Review of Systems  Constitutional: Negative for activity change.  Musculoskeletal: Positive for myalgias. Negative for back pain, joint swelling, arthralgias and neck pain.  Skin: Positive for wound.  Neurological: Negative for weakness and numbness.    Allergies  Actonel  Home Medications   Prior to Admission medications   Medication Sig Start Date End Date Taking? Authorizing Provider  Artificial Tear Ointment (ARTIFICIAL TEARS) ointment as needed.    Historical Provider, MD  Cholecalciferol (VITAMIN D PO) Take by mouth.     Historical Provider, MD  hydrochlorothiazide (MICROZIDE) 12.5 MG capsule Take 12.5 mg by mouth daily.    Historical Provider, MD  meloxicam (MOBIC) 7.5 MG tablet Take 1 tablet (7.5 mg total) by mouth daily. Take once a day as needed for hip pain. 12/30/13   Mathis Fare Presson, PA  PREDNISONE PO by Other route. Eyedrops-left eye    Historical Provider, MD  psyllium (METAMUCIL) 58.6 % packet Take 1 packet by mouth daily.    Historical Provider, MD   BP 127/72 mmHg  Pulse 69  Temp(Src) 97.7 F (36.5 C) (Oral)  Resp 18  Ht 5\' 3"  (1.6 m)  Wt 123 lb (55.792 kg)  BMI 21.79 kg/m2  SpO2 99%   Physical Exam  Constitutional: She appears well-developed and well-nourished.  HENT:  Head: Normocephalic and atraumatic.  Eyes: Pupils are equal, round, and reactive to light.  Neck: Normal range of motion. Neck supple.  Cardiovascular: Exam reveals no decreased pulses.   Musculoskeletal: She exhibits edema and tenderness.       Left wrist: Normal.       Left hand: She exhibits tenderness. She exhibits normal range of motion and no bony tenderness. Normal sensation noted. Normal strength noted.       Hands: Neurological: She is alert. No sensory deficit.  Motor, sensation, and vascular distal to the injury is fully intact.   Skin: Skin is warm and dry.  Psychiatric: She has a normal mood and affect.  Nursing note and vitals reviewed.   ED Course  Procedures (including critical care time) Labs Review Labs Reviewed - No data to display  Imaging Review No results found. I have personally reviewed and evaluated these images and lab results as part of my medical decision-making.   EKG Interpretation None       7:58 AM Patient seen and examined.   Vital signs reviewed and are as follows: BP 127/72 mmHg  Pulse 69  Temp(Src) 97.7 F (36.5 C) (Oral)  Resp 18  Ht  (1.6 m)  Wt 123 lb (55.792 kg)  BMI 21.79 kg/m2  SpO2 99%  Patient was able to remove the ring after lubrication  applied.  Patient counseled on wound care. Patient was urged to return to the Emergency Department urgently with worsening pain, swelling, expanding erythema especially if it streaks away from the affected area, fever, or if they have any other concerns. Patient verbalized understanding.   Patient believes that she is up-to-date on her tetanus. She is going to review her records at home and contact her PCP if tetanus is outstanding.   MDM   Final diagnoses:  Laceration of finger, initial encounter   Patient with laceration of finger, also with ring stuck on finger. Ring removed with lubrication. It has been greater than 12 hours since injury occurred and do not feel that suturing would be wise at this point. Furthermore, the patient has had a Band-Aid on the skin. Surrounding skin is somewhat emaciated and thin with high likelihood of tearing if sutures placed. Wound should heal well by secondary intention. Patient counseled on wound care. No concern for fracture given full range of motion.   Renne Crigler, PA-C 12/30/14 1610  Marily Memos, MD 12/30/14 [phone]

## 2014-12-30 NOTE — Discharge Instructions (Signed)
Please read and follow all provided instructions.  Your diagnoses today include:  1. Laceration of finger, initial encounter    Tests performed today include:  Vital signs. See below for your results today.   Medications prescribed:   None  Take any prescribed medications only as directed.   Home care instructions:  Follow any educational materials and wound care instructions contained in this packet.   Keep affected area above the level of your heart when possible to minimize swelling. Wash area gently twice a day with warm soapy water. Do not apply alcohol or hydrogen peroxide. Cover the area if it draining or weeping.   Follow-up instructions:  Return instructions:  Return to the Emergency Department if you have:  Fever  Worsening pain  Worsening swelling of the wound  Pus draining from the wound  Redness of the skin that moves away from the wound, especially if it streaks away from the affected area   Any other emergent concerns  Your vital signs today were: BP 127/72 mmHg   Pulse 69   Temp(Src) 97.7 F (36.5 C) (Oral)   Resp 18   Ht  (1.6 m)   Wt 123 lb (55.792 kg)   BMI 21.79 kg/m2   SpO2 99% If your blood pressure (BP) was elevated above 135/85 this visit, please have this repeated by your doctor within one month. --------------

## 2014-12-30 NOTE — ED Notes (Signed)
Pt has silver colored metal ring on Lt ring finger . Lotion applied to Lt ring finger and Pt was able to remove ring with out assistance.

## 2014-12-30 NOTE — ED Notes (Signed)
Declined W/C at D/C and was escorted to lobby by RN. 

## 2015-01-22 DIAGNOSIS — H1131 Conjunctival hemorrhage, right eye: Secondary | ICD-10-CM | POA: Diagnosis not present

## 2015-02-05 DIAGNOSIS — Z23 Encounter for immunization: Secondary | ICD-10-CM | POA: Diagnosis not present

## 2015-02-07 DIAGNOSIS — H02053 Trichiasis without entropian right eye, unspecified eyelid: Secondary | ICD-10-CM | POA: Diagnosis not present

## 2015-02-07 DIAGNOSIS — H02051 Trichiasis without entropian right upper eyelid: Secondary | ICD-10-CM | POA: Diagnosis not present

## 2015-02-07 DIAGNOSIS — H1131 Conjunctival hemorrhage, right eye: Secondary | ICD-10-CM | POA: Diagnosis not present

## 2015-03-06 DIAGNOSIS — H26492 Other secondary cataract, left eye: Secondary | ICD-10-CM | POA: Diagnosis not present

## 2015-03-06 DIAGNOSIS — Z961 Presence of intraocular lens: Secondary | ICD-10-CM | POA: Diagnosis not present

## 2015-03-06 DIAGNOSIS — H26491 Other secondary cataract, right eye: Secondary | ICD-10-CM | POA: Diagnosis not present

## 2015-03-06 DIAGNOSIS — H5712 Ocular pain, left eye: Secondary | ICD-10-CM | POA: Diagnosis not present

## 2015-03-29 DIAGNOSIS — H26492 Other secondary cataract, left eye: Secondary | ICD-10-CM | POA: Diagnosis not present

## 2015-03-29 DIAGNOSIS — Z961 Presence of intraocular lens: Secondary | ICD-10-CM | POA: Diagnosis not present

## 2015-03-29 DIAGNOSIS — H40023 Open angle with borderline findings, high risk, bilateral: Secondary | ICD-10-CM | POA: Diagnosis not present

## 2015-03-29 DIAGNOSIS — H31012 Macula scars of posterior pole (postinflammatory) (post-traumatic), left eye: Secondary | ICD-10-CM | POA: Diagnosis not present

## 2015-03-29 DIAGNOSIS — H31002 Unspecified chorioretinal scars, left eye: Secondary | ICD-10-CM | POA: Diagnosis not present

## 2015-05-04 DIAGNOSIS — R42 Dizziness and giddiness: Secondary | ICD-10-CM | POA: Diagnosis not present

## 2015-06-11 DIAGNOSIS — M79673 Pain in unspecified foot: Secondary | ICD-10-CM | POA: Diagnosis not present

## 2015-06-19 ENCOUNTER — Other Ambulatory Visit: Payer: Self-pay

## 2015-06-19 DIAGNOSIS — Z1231 Encounter for screening mammogram for malignant neoplasm of breast: Secondary | ICD-10-CM

## 2015-06-26 ENCOUNTER — Ambulatory Visit (INDEPENDENT_AMBULATORY_CARE_PROVIDER_SITE_OTHER): Payer: Commercial Managed Care - HMO | Admitting: Sports Medicine

## 2015-06-26 ENCOUNTER — Encounter: Payer: Self-pay | Admitting: Sports Medicine

## 2015-06-26 DIAGNOSIS — M2141 Flat foot [pes planus] (acquired), right foot: Secondary | ICD-10-CM

## 2015-06-26 DIAGNOSIS — L84 Corns and callosities: Secondary | ICD-10-CM

## 2015-06-26 DIAGNOSIS — M79671 Pain in right foot: Secondary | ICD-10-CM

## 2015-06-26 DIAGNOSIS — M2142 Flat foot [pes planus] (acquired), left foot: Secondary | ICD-10-CM

## 2015-06-26 DIAGNOSIS — M79672 Pain in left foot: Secondary | ICD-10-CM

## 2015-06-26 DIAGNOSIS — M2040 Other hammer toe(s) (acquired), unspecified foot: Secondary | ICD-10-CM

## 2015-06-26 NOTE — Progress Notes (Signed)
Patient ID: [name] [name], female   DOB: 12/28/1931, 82 y.o.   MRN: [id number] Subjective: [name] [name] is a 82 y.o. female patient who presents to office for evaluation of Left>Right foot pain. Patient complains of pain at the lesion present at left 3rd toe states that she gets pedicures but has been having more pain at 3rd toe at tip at callus area. Patient denies any other pedal complaints.   There are no active problems to display for this patient.   Current Outpatient Prescriptions on File Prior to Visit  Medication Sig Dispense Refill  . Artificial Tear Ointment (ARTIFICIAL TEARS) ointment as needed.    . Cholecalciferol (VITAMIN D PO) Take by mouth.    . hydrochlorothiazide (MICROZIDE) 12.5 MG capsule Take 12.5 mg by mouth daily.    . meloxicam (MOBIC) 7.5 MG tablet Take 1 tablet (7.5 mg total) by mouth daily. Take once a day as needed for hip pain. 30 tablet 0  . PREDNISONE PO by Other route. Eyedrops-left eye    . psyllium (METAMUCIL) 58.6 % packet Take 1 packet by mouth daily.     No current facility-administered medications on file prior to visit.    Allergies  Allergen Reactions  . Actonel [Risedronate Sodium]     Objective:  General: Alert and oriented x3 in no acute distress  Dermatology: Keratotic lesion present at distal left 3rd toe and medial arch on right with central nucleated core noted, no webspace macerations, no ecchymosis bilateral, all nails x 10 are well manicured.  Vascular: Dorsalis Pedis and Posterior Tibial pedal pulses 1/4, Capillary Fill Time 3 seconds, scant pedal hair growth bilateral, no edema bilateral lower extremities, Temperature gradient within normal limits.  Neurology: Michaell CowingGross sensation intact via light touch bilateral.  Musculoskeletal: Mild tenderness with palpation at the lesion site on Left>Right, Muscular strength 5/5 in all groups without pain or limitation on range of motion. Severe end stage Pes planus with bony prominence and  hammertoes bilateral.    Assessment and Plan: Problem List Items Addressed This Visit    None    Visit Diagnoses    Foot pain, bilateral    -  Primary    Callus of foot        Left 3rd toe and plantar medial foot right    Hammer toe, unspecified laterality        Pes planus of both feet           -Complete examination performed -Discussed treatment options for callus and hammertoe with pes planus  -Parred keratoic lesions using a chisel blade with out incident -Gave silicone toe protector for left 3rd toe to wear when ambulating  -Recommend continue with good supportive shoes and accommodative inserts daily  -Patient to return to office as needed or sooner if condition worsens.  Asencion Islamitorya Nile Dorning, DPM

## 2015-07-06 ENCOUNTER — Ambulatory Visit
Admission: RE | Admit: 2015-07-06 | Discharge: 2015-07-06 | Disposition: A | Discharge status: Home / Self Care | Payer: Commercial Managed Care - HMO | Source: Ambulatory Visit

## 2015-07-06 DIAGNOSIS — Z1231 Encounter for screening mammogram for malignant neoplasm of breast: Secondary | ICD-10-CM | POA: Diagnosis not present

## 2015-08-16 DIAGNOSIS — Z0000 Encounter for general adult medical examination without abnormal findings: Secondary | ICD-10-CM | POA: Diagnosis not present

## 2015-08-16 DIAGNOSIS — I10 Essential (primary) hypertension: Secondary | ICD-10-CM | POA: Diagnosis not present

## 2015-08-16 DIAGNOSIS — M810 Age-related osteoporosis without current pathological fracture: Secondary | ICD-10-CM | POA: Diagnosis not present

## 2015-09-03 DIAGNOSIS — Z0000 Encounter for general adult medical examination without abnormal findings: Secondary | ICD-10-CM | POA: Diagnosis not present

## 2015-10-02 DIAGNOSIS — M810 Age-related osteoporosis without current pathological fracture: Secondary | ICD-10-CM | POA: Diagnosis not present

## 2015-10-04 DIAGNOSIS — H31012 Macula scars of posterior pole (postinflammatory) (post-traumatic), left eye: Secondary | ICD-10-CM | POA: Diagnosis not present

## 2015-10-04 DIAGNOSIS — H40023 Open angle with borderline findings, high risk, bilateral: Secondary | ICD-10-CM | POA: Diagnosis not present

## 2016-02-19 DIAGNOSIS — J069 Acute upper respiratory infection, unspecified: Secondary | ICD-10-CM | POA: Diagnosis not present

## 2016-02-19 DIAGNOSIS — H6120 Impacted cerumen, unspecified ear: Secondary | ICD-10-CM | POA: Diagnosis not present

## 2016-02-19 DIAGNOSIS — I10 Essential (primary) hypertension: Secondary | ICD-10-CM | POA: Diagnosis not present

## 2016-04-22 DIAGNOSIS — H26491 Other secondary cataract, right eye: Secondary | ICD-10-CM | POA: Diagnosis not present

## 2016-04-22 DIAGNOSIS — H40023 Open angle with borderline findings, high risk, bilateral: Secondary | ICD-10-CM | POA: Diagnosis not present

## 2016-04-22 DIAGNOSIS — Z961 Presence of intraocular lens: Secondary | ICD-10-CM | POA: Diagnosis not present

## 2016-04-22 DIAGNOSIS — H35033 Hypertensive retinopathy, bilateral: Secondary | ICD-10-CM | POA: Diagnosis not present

## 2016-05-22 ENCOUNTER — Other Ambulatory Visit: Payer: Self-pay | Admitting: Family Medicine

## 2016-05-22 DIAGNOSIS — Z1231 Encounter for screening mammogram for malignant neoplasm of breast: Secondary | ICD-10-CM

## 2016-06-13 DIAGNOSIS — M79609 Pain in unspecified limb: Secondary | ICD-10-CM | POA: Diagnosis not present

## 2016-07-08 ENCOUNTER — Ambulatory Visit
Admission: RE | Admit: 2016-07-08 | Discharge: 2016-07-08 | Disposition: A | Discharge status: Home / Self Care | Payer: Commercial Managed Care - HMO | Source: Ambulatory Visit | Attending: Family Medicine | Admitting: Family Medicine

## 2016-07-08 DIAGNOSIS — Z1231 Encounter for screening mammogram for malignant neoplasm of breast: Secondary | ICD-10-CM

## 2016-08-28 DIAGNOSIS — R5383 Other fatigue: Secondary | ICD-10-CM | POA: Diagnosis not present

## 2016-08-28 DIAGNOSIS — I10 Essential (primary) hypertension: Secondary | ICD-10-CM | POA: Diagnosis not present

## 2016-08-28 DIAGNOSIS — M810 Age-related osteoporosis without current pathological fracture: Secondary | ICD-10-CM | POA: Diagnosis not present

## 2016-08-28 DIAGNOSIS — Z0000 Encounter for general adult medical examination without abnormal findings: Secondary | ICD-10-CM | POA: Diagnosis not present

## 2016-08-28 DIAGNOSIS — R350 Frequency of micturition: Secondary | ICD-10-CM | POA: Diagnosis not present

## 2016-09-12 DIAGNOSIS — Z1211 Encounter for screening for malignant neoplasm of colon: Secondary | ICD-10-CM | POA: Diagnosis not present

## 2016-10-21 DIAGNOSIS — H0520 Unspecified exophthalmos: Secondary | ICD-10-CM | POA: Diagnosis not present

## 2016-10-21 DIAGNOSIS — H40023 Open angle with borderline findings, high risk, bilateral: Secondary | ICD-10-CM | POA: Diagnosis not present

## 2016-11-14 DIAGNOSIS — H5712 Ocular pain, left eye: Secondary | ICD-10-CM | POA: Diagnosis not present

## 2016-11-14 DIAGNOSIS — H53142 Visual discomfort, left eye: Secondary | ICD-10-CM | POA: Diagnosis not present

## 2016-11-21 DIAGNOSIS — H53142 Visual discomfort, left eye: Secondary | ICD-10-CM | POA: Diagnosis not present

## 2016-11-21 DIAGNOSIS — H5712 Ocular pain, left eye: Secondary | ICD-10-CM | POA: Diagnosis not present

## 2016-11-21 DIAGNOSIS — H209 Unspecified iridocyclitis: Secondary | ICD-10-CM | POA: Diagnosis not present

## 2016-12-19 DIAGNOSIS — H5712 Ocular pain, left eye: Secondary | ICD-10-CM | POA: Diagnosis not present

## 2016-12-19 DIAGNOSIS — H209 Unspecified iridocyclitis: Secondary | ICD-10-CM | POA: Diagnosis not present

## 2017-01-26 ENCOUNTER — Other Ambulatory Visit: Payer: Self-pay | Admitting: Family Medicine

## 2017-01-26 ENCOUNTER — Ambulatory Visit
Admission: RE | Admit: 2017-01-26 | Discharge: 2017-01-26 | Disposition: A | Discharge status: Home / Self Care | Payer: Medicare HMO | Source: Ambulatory Visit | Attending: Family Medicine | Admitting: Family Medicine

## 2017-01-26 DIAGNOSIS — R599 Enlarged lymph nodes, unspecified: Secondary | ICD-10-CM

## 2017-01-26 DIAGNOSIS — R591 Generalized enlarged lymph nodes: Secondary | ICD-10-CM

## 2017-01-26 DIAGNOSIS — J069 Acute upper respiratory infection, unspecified: Secondary | ICD-10-CM | POA: Diagnosis not present

## 2017-01-26 DIAGNOSIS — R0602 Shortness of breath: Secondary | ICD-10-CM | POA: Diagnosis not present

## 2017-02-09 DIAGNOSIS — R05 Cough: Secondary | ICD-10-CM | POA: Diagnosis not present

## 2017-02-09 DIAGNOSIS — R591 Generalized enlarged lymph nodes: Secondary | ICD-10-CM | POA: Diagnosis not present

## 2017-03-17 ENCOUNTER — Other Ambulatory Visit: Payer: Self-pay

## 2017-03-17 ENCOUNTER — Encounter: Payer: Self-pay | Admitting: Sports Medicine

## 2017-03-17 ENCOUNTER — Ambulatory Visit: Payer: Commercial Managed Care - HMO | Admitting: Sports Medicine

## 2017-03-17 DIAGNOSIS — L02619 Cutaneous abscess of unspecified foot: Secondary | ICD-10-CM

## 2017-03-17 DIAGNOSIS — L03119 Cellulitis of unspecified part of limb: Secondary | ICD-10-CM

## 2017-03-17 DIAGNOSIS — L988 Other specified disorders of the skin and subcutaneous tissue: Secondary | ICD-10-CM

## 2017-03-17 DIAGNOSIS — I82401 Acute embolism and thrombosis of unspecified deep veins of right lower extremity: Secondary | ICD-10-CM

## 2017-03-17 DIAGNOSIS — M79604 Pain in right leg: Secondary | ICD-10-CM

## 2017-03-17 MED ORDER — ASPIRIN EC 325 MG PO TBEC: 325.0000 mg | DELAYED_RELEASE_TABLET | Freq: Every day | ORAL | 0 refills | Status: AC

## 2017-03-17 MED ORDER — DOXYCYCLINE HYCLATE 100 MG PO TABS: 100.0000 mg | ORAL_TABLET | Freq: Two times a day (BID) | ORAL | 0 refills | Status: DC

## 2017-03-17 NOTE — Progress Notes (Signed)
Patient ID: [name] [name], female   DOB: 01/11/1932, 83 y.o.   MRN: [id number] Subjective: [name] [name] is a 83 y.o. female patient who presents to office for evaluation of Right leg pain and dry skin to top. Patient complains of pain calf with warmth and redness and seeing a "knot of back of calf". Reports that she did travel for the holidays and since she got back she has had pain and swelling in right leg. Denies chest pain, sob, denies foreign travel, insect bite, known injury/trauma. Admits that she got new shoes over the thanksgiving holiday but otherwise nothing else new. Patient denies any other pedal complaints.   There are no active problems to display for this patient.   Current Outpatient Medications on File Prior to Visit  Medication Sig Dispense Refill  . alendronate (FOSAMAX) 70 MG tablet     . Artificial Tear Ointment (ARTIFICIAL TEARS) ointment as needed.    . Cholecalciferol (VITAMIN D PO) Take by mouth.    Marland Kitchen. FLUAD 0.5 ML SUSY ADM 0.5ML IM UTD  0  . hydrochlorothiazide (HYDRODIURIL) 12.5 MG tablet 1 TABLET ONCE A DAY ORALLY  3  . hydrochlorothiazide (MICROZIDE) 12.5 MG capsule Take 12.5 mg by mouth daily.    . meloxicam (MOBIC) 7.5 MG tablet Take 1 tablet (7.5 mg total) by mouth daily. Take once a day as needed for hip pain. 30 tablet 0  . prednisoLONE acetate (PRED FORTE) 1 % ophthalmic suspension     . PREDNISONE PO by Other route. Eyedrops-left eye    . psyllium (METAMUCIL) 58.6 % packet Take 1 packet by mouth daily.     No current facility-administered medications on file prior to visit.     Allergies  Allergen Reactions  . Actonel [Risedronate Sodium]     Objective:  General: Alert and oriented x3 in no acute distress  Dermatology: Dry skin to dorsum on right foot and lower leg, + webspace macerations, no ecchymosis bilateral, all nails x 10 are well manicured.  Vascular: Dorsalis Pedis and Posterior Tibial pedal pulses 1/4, Capillary Fill Time 3 seconds,  scant pedal hair growth bilateral, + focal edema right  lower extremity, Temperature gradient increased at calf.  Neurology: Gross sensation intact via light touch bilateral.  Musculoskeletal: Mild tenderness with palpation at Right calf,  Severe end stage Pes planus with bony prominence and hammertoes bilateral.    Assessment and Plan: Problem List Items Addressed This Visit    None    Visit Diagnoses    Cellulitis and abscess of foot, except toes    -  Primary   Right leg pain       Acute deep vein thrombosis (DVT) of right lower extremity, unspecified vein (HCC)       Relevant Medications   hydrochlorothiazide (HYDRODIURIL) 12.5 MG tablet   aspirin EC 325 MG tablet   Maceration of skin          -Complete examination performed -Discussed treatment options for right cellulitis vs possible DVT -Advised patient that "dry skin" if from the swelling decreasing in her right leg -Applied castallini's paint to webspaces and advised patient to dry well daily  -Rx Doxycyline to take as instructed and a full dose of Aspirin to protect against possible DVT -Rx Duplex ultrasound to check for DVT stat -Recommend rest and elevation to assist with pain and edema control to right leg -Recommend patient to go to ER if symptoms worsens -Recommend continue with good supportive shoes and accommodative inserts  daily  -Patient to return to office after Ultrasound or sooner if condition worsens.  Asencion Islamitorya Ali Mclaurin, DPM

## 2017-03-18 ENCOUNTER — Telehealth: Payer: Self-pay | Admitting: Sports Medicine

## 2017-03-18 NOTE — Telephone Encounter (Signed)
I was seen in the office yesterday. Dr. Marylene LandStover prescribed doxycycline for me and the instructions said to take without food. I took it without food and it did not agree with me. I wanted to know if I could take something with it or before I take it. You can call me back at [phone] and if I don't answer you can leave a message on a recording device. I would appreciate that very much. Thank you.

## 2017-03-18 NOTE — Telephone Encounter (Signed)
Instructed patient to take antibiotic with food

## 2017-03-20 ENCOUNTER — Other Ambulatory Visit: Payer: Self-pay | Admitting: Family Medicine

## 2017-03-20 ENCOUNTER — Ambulatory Visit
Admission: RE | Admit: 2017-03-20 | Discharge: 2017-03-20 | Disposition: A | Discharge status: Home / Self Care | Payer: Medicare HMO | Source: Ambulatory Visit | Attending: Family Medicine | Admitting: Family Medicine

## 2017-03-20 DIAGNOSIS — M79604 Pain in right leg: Secondary | ICD-10-CM

## 2017-03-20 DIAGNOSIS — M79661 Pain in right lower leg: Secondary | ICD-10-CM | POA: Diagnosis not present

## 2017-03-26 ENCOUNTER — Encounter (HOSPITAL_COMMUNITY): Payer: Medicare HMO

## 2017-04-16 ENCOUNTER — Telehealth: Payer: Self-pay | Admitting: *Deleted

## 2017-04-16 NOTE — Telephone Encounter (Signed)
Unable to leave message informing pt she could stop the Aspirin 325mg  since her US was negative for DVT, and I was unable to check pt's status.

## 2017-04-16 NOTE — Telephone Encounter (Signed)
Refill request Aspirin 325mg .

## 2017-04-16 NOTE — Telephone Encounter (Signed)
She can stop the Aspirin since her ultrasound is negative for a clot. If her symptoms worsen or if there is sob, pain in her chest or leg please have her go to ER or PCP. Thanks Dr. Marylene LandStover

## 2017-04-17 NOTE — Telephone Encounter (Signed)
Left message informing pt I had information concerning the aspirin refill request.

## 2017-04-17 NOTE — Telephone Encounter (Signed)
Thanks -Dr S 

## 2017-04-17 NOTE — Telephone Encounter (Signed)
Pt states she is doing fine and by the end of the week after testing her doctor's office had told her to stop the Aspirin 325mg  and to begin 81mg  daily.

## 2017-05-05 DIAGNOSIS — Z961 Presence of intraocular lens: Secondary | ICD-10-CM | POA: Diagnosis not present

## 2017-05-05 DIAGNOSIS — H209 Unspecified iridocyclitis: Secondary | ICD-10-CM | POA: Diagnosis not present

## 2017-05-05 DIAGNOSIS — H40023 Open angle with borderline findings, high risk, bilateral: Secondary | ICD-10-CM | POA: Diagnosis not present

## 2017-05-05 DIAGNOSIS — H02051 Trichiasis without entropian right upper eyelid: Secondary | ICD-10-CM | POA: Diagnosis not present

## 2017-05-05 DIAGNOSIS — H0520 Unspecified exophthalmos: Secondary | ICD-10-CM | POA: Diagnosis not present

## 2017-07-16 DIAGNOSIS — M79671 Pain in right foot: Secondary | ICD-10-CM | POA: Diagnosis not present

## 2017-08-06 ENCOUNTER — Other Ambulatory Visit: Payer: Self-pay | Admitting: Family Medicine

## 2017-08-06 DIAGNOSIS — Z1231 Encounter for screening mammogram for malignant neoplasm of breast: Secondary | ICD-10-CM

## 2017-08-13 DIAGNOSIS — J309 Allergic rhinitis, unspecified: Secondary | ICD-10-CM | POA: Diagnosis not present

## 2017-08-25 DIAGNOSIS — H01003 Unspecified blepharitis right eye, unspecified eyelid: Secondary | ICD-10-CM | POA: Diagnosis not present

## 2017-08-25 DIAGNOSIS — H1011 Acute atopic conjunctivitis, right eye: Secondary | ICD-10-CM | POA: Diagnosis not present

## 2017-08-27 ENCOUNTER — Ambulatory Visit
Admission: RE | Admit: 2017-08-27 | Discharge: 2017-08-27 | Disposition: A | Discharge status: Home / Self Care | Payer: Medicare HMO | Source: Ambulatory Visit | Attending: Family Medicine | Admitting: Family Medicine

## 2017-08-27 ENCOUNTER — Ambulatory Visit: Payer: Medicare HMO

## 2017-08-27 DIAGNOSIS — Z1231 Encounter for screening mammogram for malignant neoplasm of breast: Secondary | ICD-10-CM

## 2017-08-28 DIAGNOSIS — M25571 Pain in right ankle and joints of right foot: Secondary | ICD-10-CM | POA: Diagnosis not present

## 2017-08-28 DIAGNOSIS — M25572 Pain in left ankle and joints of left foot: Secondary | ICD-10-CM | POA: Diagnosis not present

## 2017-08-28 DIAGNOSIS — M76821 Posterior tibial tendinitis, right leg: Secondary | ICD-10-CM | POA: Diagnosis not present

## 2017-08-28 DIAGNOSIS — M76822 Posterior tibial tendinitis, left leg: Secondary | ICD-10-CM | POA: Diagnosis not present

## 2017-08-31 DIAGNOSIS — M810 Age-related osteoporosis without current pathological fracture: Secondary | ICD-10-CM | POA: Diagnosis not present

## 2017-08-31 DIAGNOSIS — I10 Essential (primary) hypertension: Secondary | ICD-10-CM | POA: Diagnosis not present

## 2017-08-31 DIAGNOSIS — M859 Disorder of bone density and structure, unspecified: Secondary | ICD-10-CM | POA: Diagnosis not present

## 2017-08-31 DIAGNOSIS — Z1389 Encounter for screening for other disorder: Secondary | ICD-10-CM | POA: Diagnosis not present

## 2017-08-31 DIAGNOSIS — Z0000 Encounter for general adult medical examination without abnormal findings: Secondary | ICD-10-CM | POA: Diagnosis not present

## 2017-09-29 DIAGNOSIS — K219 Gastro-esophageal reflux disease without esophagitis: Secondary | ICD-10-CM | POA: Diagnosis not present

## 2017-09-29 DIAGNOSIS — M810 Age-related osteoporosis without current pathological fracture: Secondary | ICD-10-CM | POA: Diagnosis not present

## 2017-09-29 DIAGNOSIS — R152 Fecal urgency: Secondary | ICD-10-CM | POA: Diagnosis not present

## 2017-10-22 DIAGNOSIS — Z7983 Long term (current) use of bisphosphonates: Secondary | ICD-10-CM | POA: Diagnosis not present

## 2017-10-22 DIAGNOSIS — M810 Age-related osteoporosis without current pathological fracture: Secondary | ICD-10-CM | POA: Diagnosis not present

## 2017-10-27 DIAGNOSIS — H40023 Open angle with borderline findings, high risk, bilateral: Secondary | ICD-10-CM | POA: Diagnosis not present

## 2017-10-27 DIAGNOSIS — H0520 Unspecified exophthalmos: Secondary | ICD-10-CM | POA: Diagnosis not present

## 2018-03-04 DIAGNOSIS — M1990 Unspecified osteoarthritis, unspecified site: Secondary | ICD-10-CM | POA: Diagnosis not present

## 2018-03-04 DIAGNOSIS — I10 Essential (primary) hypertension: Secondary | ICD-10-CM | POA: Diagnosis not present

## 2018-03-04 DIAGNOSIS — M62838 Other muscle spasm: Secondary | ICD-10-CM | POA: Diagnosis not present

## 2018-03-04 DIAGNOSIS — Z23 Encounter for immunization: Secondary | ICD-10-CM | POA: Diagnosis not present

## 2018-03-23 DIAGNOSIS — M79605 Pain in left leg: Secondary | ICD-10-CM | POA: Diagnosis not present

## 2018-03-23 DIAGNOSIS — M79604 Pain in right leg: Secondary | ICD-10-CM | POA: Diagnosis not present

## 2018-05-11 DIAGNOSIS — H26492 Other secondary cataract, left eye: Secondary | ICD-10-CM | POA: Diagnosis not present

## 2018-05-11 DIAGNOSIS — H35033 Hypertensive retinopathy, bilateral: Secondary | ICD-10-CM | POA: Diagnosis not present

## 2018-05-11 DIAGNOSIS — H26491 Other secondary cataract, right eye: Secondary | ICD-10-CM | POA: Diagnosis not present

## 2018-05-11 DIAGNOSIS — H40023 Open angle with borderline findings, high risk, bilateral: Secondary | ICD-10-CM | POA: Diagnosis not present

## 2018-06-10 DIAGNOSIS — H0510 Unspecified chronic inflammatory disorders of orbit: Secondary | ICD-10-CM | POA: Diagnosis not present

## 2018-06-10 DIAGNOSIS — Z791 Long term (current) use of non-steroidal anti-inflammatories (NSAID): Secondary | ICD-10-CM | POA: Diagnosis not present

## 2018-06-10 DIAGNOSIS — Z803 Family history of malignant neoplasm of breast: Secondary | ICD-10-CM | POA: Diagnosis not present

## 2018-06-10 DIAGNOSIS — I10 Essential (primary) hypertension: Secondary | ICD-10-CM | POA: Diagnosis not present

## 2018-06-10 DIAGNOSIS — Z833 Family history of diabetes mellitus: Secondary | ICD-10-CM | POA: Diagnosis not present

## 2018-06-10 DIAGNOSIS — Z809 Family history of malignant neoplasm, unspecified: Secondary | ICD-10-CM | POA: Diagnosis not present

## 2018-06-10 DIAGNOSIS — H04129 Dry eye syndrome of unspecified lacrimal gland: Secondary | ICD-10-CM | POA: Diagnosis not present

## 2018-06-10 DIAGNOSIS — M1990 Unspecified osteoarthritis, unspecified site: Secondary | ICD-10-CM | POA: Diagnosis not present

## 2018-06-10 DIAGNOSIS — K5900 Constipation, unspecified: Secondary | ICD-10-CM | POA: Diagnosis not present

## 2018-06-10 DIAGNOSIS — Z8249 Family history of ischemic heart disease and other diseases of the circulatory system: Secondary | ICD-10-CM | POA: Diagnosis not present

## 2018-06-11 ENCOUNTER — Ambulatory Visit (HOSPITAL_COMMUNITY)
Admission: EM | Admit: 2018-06-11 | Discharge: 2018-06-11 | Disposition: A | Discharge status: Home / Self Care | Payer: Medicare HMO | Attending: Emergency Medicine | Admitting: Emergency Medicine

## 2018-06-11 ENCOUNTER — Encounter (HOSPITAL_COMMUNITY): Payer: Self-pay | Admitting: Emergency Medicine

## 2018-06-11 ENCOUNTER — Other Ambulatory Visit: Payer: Self-pay

## 2018-06-11 DIAGNOSIS — R030 Elevated blood-pressure reading, without diagnosis of hypertension: Secondary | ICD-10-CM

## 2018-06-11 DIAGNOSIS — R51 Headache: Secondary | ICD-10-CM | POA: Diagnosis not present

## 2018-06-11 DIAGNOSIS — R519 Headache, unspecified: Secondary | ICD-10-CM

## 2018-06-11 MED ORDER — ACETAMINOPHEN 325 MG PO TABS
ORAL_TABLET | ORAL | Status: AC
  Filled 2018-06-11: qty 2

## 2018-06-11 MED ORDER — ACETAMINOPHEN 500 MG PO TABS: 500.0000 mg | ORAL_TABLET | Freq: Four times a day (QID) | ORAL | 0 refills | Status: AC | PRN

## 2018-06-11 MED ORDER — ACETAMINOPHEN 325 MG PO TABS
650.0000 mg | ORAL_TABLET | Freq: Once | ORAL | Status: AC
  Administered 2018-06-11: 650 mg via ORAL

## 2018-06-11 NOTE — Discharge Instructions (Signed)
I would expect improvement of headache over the next 4-6 hours.  If worsening of headache, vision change, dizziness, chest pain, shortness of breath please go to the ER.  If still with headache tomorrow please recheck your blood pressure. Return to be seen or go to ER if >180 May take tylenol as needed every 6 hours for pain. Ensure drinking plenty of fluids.  Please call to follow up with your primary care provider for blood pressure recheck.  Please see provided diet recommendations which can help with your blood pressure.

## 2018-06-11 NOTE — ED Triage Notes (Addendum)
Pt here today for elevated BP.  She states that her home health RN told her it was elevated yesterday as well. Pt complains of headache in the back of her head.  Pt states she has been taking her medications as prescribed.  Pt also states that earlier today her eyes were not focusing right.

## 2018-06-11 NOTE — ED Provider Notes (Signed)
[facility]    CSN: [id number] Arrival date & time: 06/11/18  1318     History   Chief Complaint Chief Complaint  Patient presents with  . Hypertension    HPI [name] [name] is a 85 y.o. female.   [name] presents with complaints of frontal headache which she noted this am. States the pain started in the front of her head and then moved to posterior head. Now frontal headache again. States feels similar to previous headaches she has had in the past but does not have them regularly. Unable to describe, states it "hurts." 5/10 in intensity. Home health nurse was at her house yesterday and noted elevated bp, telling [name] to be seen if develops a headache. bp today 164/87 and 150/80, at home. States had an episode earlier today where it seemed her eyes weren't focusing well, which has resolved. She does have chronic eye issues and follows with an eye doctor. Denies dizziness, weakness, chest pain, confusion, numbness or tingling. Hasn't taken any medications for her pain. Takes hctz for BP.  No leg pain or swelling. No head injury.    ROS per HPI.      Past Medical History:  Diagnosis Date  . Dry eyes   . Hypertension     There are no active problems to display for this patient.   Past Surgical History:  Procedure Laterality Date  . ABDOMINAL HYSTERECTOMY    . BREAST BIOPSY Right 05/20/2013  . CATARACT EXTRACTION    . CHOLECYSTECTOMY    . EYE SURGERY    . HERNIA REPAIR    . TONSILLECTOMY      OB History   No obstetric history on file.      Home Medications    Prior to Admission medications   Medication Sig Start Date End Date Taking? Authorizing Provider  alendronate (FOSAMAX) 70 MG tablet  03/11/17  Yes [provider]  Artificial Tear Ointment (ARTIFICIAL TEARS) ointment as needed.   Yes [provider]  aspirin EC 325 MG tablet Take 1 tablet (325 mg total) by mouth daily. 03/17/17  Yes Stover, Titorya, DPM  Cholecalciferol  (VITAMIN D PO) Take by mouth.   Yes [provider]  hydrochlorothiazide (HYDRODIURIL) 12.5 MG tablet 1 TABLET ONCE A DAY ORALLY 02/19/17  Yes [provider]  prednisoLONE acetate (PRED FORTE) 1 % ophthalmic suspension  12/19/16  Yes [provider]  PREDNISONE PO by Other route. Eyedrops-left eye   Yes [provider]  psyllium (METAMUCIL) 58.6 % packet Take 1 packet by mouth daily.   Yes [provider]  acetaminophen (TYLENOL) 500 MG tablet Take 1 tablet (500 mg total) by mouth every 6 (six) hours as needed. 06/11/18   Georgetta Haber, NP  doxycycline (VIBRA-TABS) 100 MG tablet Take 1 tablet (100 mg total) by mouth 2 (two) times daily. 03/17/17   Asencion Islam, DPM  FLUAD 0.5 ML SUSY ADM 0.5ML IM UTD 01/13/17   [provider]  hydrochlorothiazide (MICROZIDE) 12.5 MG capsule Take 12.5 mg by mouth daily.    [provider]  meloxicam (MOBIC) 7.5 MG tablet Take 1 tablet (7.5 mg total) by mouth daily. Take once a day as needed for hip pain. 12/30/13   Presson, Mathis Fare, PA    Family History Family History  Problem Relation Age of Onset  . Breast cancer Sister     Social History Social History   Tobacco Use  . Smoking status: Never Smoker  .  Smokeless tobacco: Never Used  Substance Use Topics  . Alcohol use: No  . Drug use: No     Allergies   Actonel [risedronate sodium]   Review of Systems Review of Systems   Physical Exam Triage Vital Signs ED Triage Vitals  Enc Vitals Group     BP 06/11/18 1353 (!) 176/76     Pulse Rate 06/11/18 1353 79     Resp 06/11/18 1353 16     Temp 06/11/18 1353 98.2 F (36.8 C)     Temp Source 06/11/18 1353 Oral     SpO2 06/11/18 1353 100 %     Weight --      Height --      Head Circumference --      Peak Flow --      Pain Score 06/11/18 1413 5     Pain Loc --      Pain Edu? --      Excl. in GC? --    No data found.  Updated Vital Signs BP (!) 167/77 (BP Location:  Right Arm)   Pulse 79   Temp 98.2 F (36.8 C) (Oral)   Resp 16   SpO2 100%    Physical Exam Constitutional:      General: She is not in acute distress.    Appearance: She is well-developed.  HENT:     Head: Normocephalic.  Cardiovascular:     Rate and Rhythm: Normal rate and regular rhythm.     Heart sounds: Normal heart sounds.  Pulmonary:     Effort: Pulmonary effort is normal.     Breath sounds: Normal breath sounds.  Skin:    General: Skin is warm and dry.  Neurological:     General: No focal deficit present.     Mental Status: She is alert and oriented to person, place, and time.     GCS: GCS eye subscore is 4. GCS verbal subscore is 5. GCS motor subscore is 6.     Cranial Nerves: Cranial nerves are intact.     Sensory: Sensation is intact.     Motor: Motor function is intact.     Coordination: Coordination is intact.     Gait: Gait is intact.      UC Treatments / Results  Labs (all labs ordered are listed, but only abnormal results are displayed) Labs Reviewed - No data to display  EKG None  Radiology No results found.  Procedures Procedures (including critical care time)  Medications Ordered in UC Medications  acetaminophen (TYLENOL) tablet 650 mg (has no administration in time range)    Initial Impression / Assessment and Plan / UC Course  I have reviewed the triage vital signs and the nursing notes.  Pertinent labs & imaging results that were available during my care of the patient were reviewed by me and considered in my medical decision making (see chart for details).     Bp 167/77 here in clinic. She has been taking her hctz. No red flag findings on exam today. She does live alone. Headache feels quite similar to previous headaches but she felt concerned after home health nurse visit. Will treat headache with tylenol with strict return precautions discussed. Patient verbalized understanding and agreeable to plan. Encouraged follow up with pcp  for bp recheck as well. Ambulatory out of clinic without difficulty.   Final Clinical Impressions(s) / UC Diagnoses   Final diagnoses:  Acute nonintractable headache, unspecified headache type  Elevated blood pressure reading  Discharge Instructions     I would expect improvement of headache over the next 4-6 hours.  If worsening of headache, vision change, dizziness, chest pain, shortness of breath please go to the ER.  If still with headache tomorrow please recheck your blood pressure. Return to be seen or go to ER if >180 May take tylenol as needed every 6 hours for pain. Ensure drinking plenty of fluids.  Please call to follow up with your primary care provider for blood pressure recheck.  Please see provided diet recommendations which can help with your blood pressure.    ED Prescriptions    Medication Sig Dispense Auth. Provider   acetaminophen (TYLENOL) 500 MG tablet Take 1 tablet (500 mg total) by mouth every 6 (six) hours as needed. 30 tablet Georgetta Haber, NP     Controlled Substance Prescriptions [place] Controlled Substance Registry consulted? Not Applicable   Georgetta Haber, NP 06/11/18 1450

## 2018-06-17 DIAGNOSIS — I10 Essential (primary) hypertension: Secondary | ICD-10-CM | POA: Diagnosis not present

## 2018-06-17 DIAGNOSIS — R5383 Other fatigue: Secondary | ICD-10-CM | POA: Diagnosis not present

## 2018-06-17 DIAGNOSIS — E559 Vitamin D deficiency, unspecified: Secondary | ICD-10-CM | POA: Diagnosis not present

## 2018-10-08 DIAGNOSIS — E559 Vitamin D deficiency, unspecified: Secondary | ICD-10-CM | POA: Diagnosis not present

## 2018-10-08 DIAGNOSIS — Z0000 Encounter for general adult medical examination without abnormal findings: Secondary | ICD-10-CM | POA: Diagnosis not present

## 2018-10-08 DIAGNOSIS — H35033 Hypertensive retinopathy, bilateral: Secondary | ICD-10-CM | POA: Diagnosis not present

## 2018-10-08 DIAGNOSIS — K219 Gastro-esophageal reflux disease without esophagitis: Secondary | ICD-10-CM | POA: Diagnosis not present

## 2018-10-08 DIAGNOSIS — J309 Allergic rhinitis, unspecified: Secondary | ICD-10-CM | POA: Diagnosis not present

## 2018-10-08 DIAGNOSIS — D649 Anemia, unspecified: Secondary | ICD-10-CM | POA: Diagnosis not present

## 2018-10-08 DIAGNOSIS — I10 Essential (primary) hypertension: Secondary | ICD-10-CM | POA: Diagnosis not present

## 2018-10-08 DIAGNOSIS — M1990 Unspecified osteoarthritis, unspecified site: Secondary | ICD-10-CM | POA: Diagnosis not present

## 2018-10-08 DIAGNOSIS — N183 Chronic kidney disease, stage 3 (moderate): Secondary | ICD-10-CM | POA: Diagnosis not present

## 2018-10-08 DIAGNOSIS — M810 Age-related osteoporosis without current pathological fracture: Secondary | ICD-10-CM | POA: Diagnosis not present

## 2018-11-02 ENCOUNTER — Emergency Department (HOSPITAL_COMMUNITY): Payer: Medicare HMO

## 2018-11-02 ENCOUNTER — Other Ambulatory Visit: Payer: Self-pay

## 2018-11-02 ENCOUNTER — Emergency Department (HOSPITAL_COMMUNITY)
Admission: EM | Admit: 2018-11-02 | Discharge: 2018-11-02 | Disposition: A | Discharge status: Home / Self Care | Payer: Medicare HMO | Attending: Emergency Medicine | Admitting: Emergency Medicine

## 2018-11-02 ENCOUNTER — Encounter (HOSPITAL_COMMUNITY): Payer: Self-pay

## 2018-11-02 DIAGNOSIS — I499 Cardiac arrhythmia, unspecified: Secondary | ICD-10-CM | POA: Diagnosis not present

## 2018-11-02 DIAGNOSIS — Z79899 Other long term (current) drug therapy: Secondary | ICD-10-CM | POA: Insufficient documentation

## 2018-11-02 DIAGNOSIS — S92355A Nondisplaced fracture of fifth metatarsal bone, left foot, initial encounter for closed fracture: Secondary | ICD-10-CM

## 2018-11-02 DIAGNOSIS — E860 Dehydration: Secondary | ICD-10-CM | POA: Diagnosis not present

## 2018-11-02 DIAGNOSIS — Y939 Activity, unspecified: Secondary | ICD-10-CM | POA: Insufficient documentation

## 2018-11-02 DIAGNOSIS — W010XXA Fall on same level from slipping, tripping and stumbling without subsequent striking against object, initial encounter: Secondary | ICD-10-CM | POA: Insufficient documentation

## 2018-11-02 DIAGNOSIS — I10 Essential (primary) hypertension: Secondary | ICD-10-CM | POA: Insufficient documentation

## 2018-11-02 DIAGNOSIS — I951 Orthostatic hypotension: Secondary | ICD-10-CM | POA: Diagnosis not present

## 2018-11-02 DIAGNOSIS — R110 Nausea: Secondary | ICD-10-CM | POA: Diagnosis not present

## 2018-11-02 DIAGNOSIS — R55 Syncope and collapse: Secondary | ICD-10-CM | POA: Diagnosis not present

## 2018-11-02 DIAGNOSIS — Y999 Unspecified external cause status: Secondary | ICD-10-CM | POA: Diagnosis not present

## 2018-11-02 DIAGNOSIS — S299XXA Unspecified injury of thorax, initial encounter: Secondary | ICD-10-CM | POA: Diagnosis not present

## 2018-11-02 DIAGNOSIS — R400 Somnolence: Secondary | ICD-10-CM | POA: Diagnosis not present

## 2018-11-02 DIAGNOSIS — Y929 Unspecified place or not applicable: Secondary | ICD-10-CM | POA: Diagnosis not present

## 2018-11-02 DIAGNOSIS — S92352A Displaced fracture of fifth metatarsal bone, left foot, initial encounter for closed fracture: Secondary | ICD-10-CM | POA: Diagnosis not present

## 2018-11-02 HISTORY — DX: Cardiac murmur, unspecified: R01.1

## 2018-11-02 LAB — URINALYSIS, ROUTINE W REFLEX MICROSCOPIC
Bilirubin Urine: NEGATIVE
Glucose, UA: NEGATIVE mg/dL
Hgb urine dipstick: NEGATIVE
Ketones, ur: NEGATIVE mg/dL
Leukocytes,Ua: NEGATIVE
Nitrite: NEGATIVE
Protein, ur: NEGATIVE mg/dL
Specific Gravity, Urine: 1.005 (ref 1.005–1.030)
pH: 8 (ref 5.0–8.0)

## 2018-11-02 LAB — CBC WITH DIFFERENTIAL/PLATELET
Abs Immature Granulocytes: 0.01 10*3/uL (ref 0.00–0.07)
Basophils Absolute: 0 10*3/uL (ref 0.0–0.1)
Basophils Relative: 1 %
Eosinophils Absolute: 0 10*3/uL (ref 0.0–0.5)
Eosinophils Relative: 1 %
HCT: 40.2 % (ref 36.0–46.0)
Hemoglobin: 12.8 g/dL (ref 12.0–15.0)
Immature Granulocytes: 0 %
Lymphocytes Relative: 27 %
Lymphs Abs: 1.1 10*3/uL (ref 0.7–4.0)
MCH: 31.4 pg (ref 26.0–34.0)
MCHC: 31.8 g/dL (ref 30.0–36.0)
MCV: 98.8 fL (ref 80.0–100.0)
Monocytes Absolute: 0.3 10*3/uL (ref 0.1–1.0)
Monocytes Relative: 8 %
Neutro Abs: 2.7 10*3/uL (ref 1.7–7.7)
Neutrophils Relative %: 63 %
Platelets: 244 10*3/uL (ref 150–400)
RBC: 4.07 MIL/uL (ref 3.87–5.11)
RDW: 12.4 % (ref 11.5–15.5)
WBC: 4.2 10*3/uL (ref 4.0–10.5)
nRBC: 0 % (ref 0.0–0.2)

## 2018-11-02 LAB — COMPREHENSIVE METABOLIC PANEL
ALT: 16 U/L (ref 0–44)
AST: 21 U/L (ref 15–41)
Albumin: 4 g/dL (ref 3.5–5.0)
Alkaline Phosphatase: 44 U/L (ref 38–126)
Anion gap: 10 (ref 5–15)
BUN: 18 mg/dL (ref 8–23)
CO2: 28 mmol/L (ref 22–32)
Calcium: 10 mg/dL (ref 8.9–10.3)
Chloride: 96 mmol/L — ABNORMAL LOW (ref 98–111)
Creatinine, Ser: 0.73 mg/dL (ref 0.44–1.00)
GFR calc Af Amer: >60 mL/min (ref >60)
GFR calc non Af Amer: >60 mL/min (ref >60)
Glucose, Bld: 97 mg/dL (ref 70–99)
Potassium: 4.2 mmol/L (ref 3.5–5.1)
Sodium: 134 mmol/L — ABNORMAL LOW (ref 135–145)
Total Bilirubin: 0.7 mg/dL (ref 0.3–1.2)
Total Protein: 7.9 g/dL (ref 6.5–8.1)

## 2018-11-02 LAB — TROPONIN I (HIGH SENSITIVITY)
Troponin I (High Sensitivity): 9 ng/L (ref <18)
Troponin I (High Sensitivity): 9 ng/L (ref <18)

## 2018-11-02 LAB — CBG MONITORING, ED: Glucose-Capillary: 99 mg/dL (ref 70–99)

## 2018-11-02 MED ORDER — SODIUM CHLORIDE 0.9 % IV SOLN: INTRAVENOUS | Status: DC

## 2018-11-02 MED ORDER — SODIUM CHLORIDE 0.9 % IV BOLUS
1000.0000 mL | Freq: Once | INTRAVENOUS | Status: AC
  Administered 2018-11-02: 14:00:00 1000 mL via INTRAVENOUS

## 2018-11-02 NOTE — Discharge Instructions (Signed)
Wear  your orthopedic shoes.  Tylenol for pain.

## 2018-11-02 NOTE — ED Notes (Signed)
EKG given to Dr. Knapp. 

## 2018-11-02 NOTE — ED Triage Notes (Signed)
Per EMS: Pt was sitting at a stool at her table.  Pt stood up and saw black dots and then passed out and woke up on the ground.  Pt called EMS when she woke up.  Pt ambulatory on arrival.  Pt was nauseated this am, and has not eaten all day.  Pt c/o of toe pain.  No other pt c/o.  Pt did not take morning medications.

## 2018-11-02 NOTE — ED Provider Notes (Signed)
[facility]-EMERGENCY DEPT Provider Note   CSN: [id number] Arrival date & time: 11/02/18  1147    History   Chief Complaint Chief Complaint  Patient presents with   Loss of Consciousness    HPI [name] [name] is a 85 y.o. female.     Pt presents to the ED today with a syncopal episode.  The pt said she was sitting on a stool and stood up.  She saw "black spots" and woke up on the ground.  She hurt her left small toe, but otherwise no other injuries.  She was able to get up and crawl back to her bed.  She called her doctor who told her to come in and be evaluated.  She did not take any of her meds yet today.  She is not on blood thinners.  She denies fevers, but has felt some chills.  She's been to church, but only outside services.  She has not been eating much, but has been drinking fluids.  Pt lives alone.     Past Medical History:  Diagnosis Date   Dry eyes    Heart murmur    Hypertension     There are no active problems to display for this patient.   Past Surgical History:  Procedure Laterality Date   ABDOMINAL HYSTERECTOMY     BREAST BIOPSY Right 05/20/2013   CATARACT EXTRACTION     CHOLECYSTECTOMY     EYE SURGERY     HERNIA REPAIR     TONSILLECTOMY       OB History   No obstetric history on file.      Home Medications    Prior to Admission medications   Medication Sig Start Date End Date Taking? Authorizing Provider  acetaminophen (TYLENOL) 500 MG tablet Take 1 tablet (500 mg total) by mouth every 6 (six) hours as needed. Patient taking differently: Take 500 mg by mouth every 6 (six) hours as needed for mild pain.  06/11/18  Yes Georgetta HaberBurky, Natalie B, NP  Artificial Tear Ointment (ARTIFICIAL TEARS) ointment Place 1 drop into both eyes as needed (dry eyes).    Yes [provider]  Cholecalciferol (VITAMIN D PO) Take 1,000 Units by mouth daily.    Yes [provider]  ferrous sulfate 325 (65 FE) MG EC tablet  Take 325 mg by mouth daily.   Yes [provider]  hydrochlorothiazide (MICROZIDE) 12.5 MG capsule Take 12.5 mg by mouth daily.   Yes [provider]  polyethylene glycol (MIRALAX / GLYCOLAX) 17 g packet Take 17 g by mouth daily as needed for mild constipation.   Yes [provider]  prednisoLONE acetate (PRED FORTE) 1 % ophthalmic suspension Place 1 drop into the left eye daily.  12/19/16  Yes [provider]  aspirin EC 325 MG tablet Take 1 tablet (325 mg total) by mouth daily. Patient not taking: Reported on 11/02/2018 03/17/17   Asencion IslamStover, Titorya, DPM  meloxicam (MOBIC) 7.5 MG tablet Take 1 tablet (7.5 mg total) by mouth daily. Take once a day as needed for hip pain. Patient not taking: Reported on 11/02/2018 12/30/13   Presson, Mathis FareJennifer Lee H, PA    Family History Family History  Problem Relation Age of Onset   Breast cancer Sister     Social History Social History   Tobacco Use   Smoking status: Never Smoker   Smokeless tobacco: Never Used  Substance Use Topics   Alcohol use: No   Drug use: No  Allergies   Risedronate sodium   Review of Systems Review of Systems  Musculoskeletal:       Left small toe pain  Neurological: Positive for syncope.  All other systems reviewed and are negative.    Physical Exam Updated Vital Signs BP (!) 174/108    Pulse 72    Temp 98.1 F (36.7 C) (Oral)    Resp 16    Ht 5\' 2"  (1.575 m)    Wt 56.7 kg    SpO2 100%    BMI 22.86 kg/m   Physical Exam Vitals signs and nursing note reviewed.  Constitutional:      Appearance: Normal appearance.  HENT:     Head: Normocephalic and atraumatic.     Right Ear: External ear normal.     Left Ear: External ear normal.     Nose: Nose normal.     Mouth/Throat:     Mouth: Mucous membranes are moist.     Pharynx: Oropharynx is clear.  Eyes:     Extraocular Movements: Extraocular movements intact.     Conjunctiva/sclera: Conjunctivae normal.     Pupils:  Pupils are equal, round, and reactive to light.  Neck:     Musculoskeletal: Normal range of motion and neck supple.  Cardiovascular:     Rate and Rhythm: Normal rate and regular rhythm.     Pulses: Normal pulses.     Heart sounds: Normal heart sounds.  Pulmonary:     Effort: Pulmonary effort is normal.     Breath sounds: Normal breath sounds.  Abdominal:     General: Abdomen is flat. Bowel sounds are normal.     Palpations: Abdomen is soft.  Musculoskeletal: Normal range of motion.     Comments: Left 5th toe pain  Skin:    General: Skin is warm.     Capillary Refill: Capillary refill takes less than 2 seconds.  Neurological:     General: No focal deficit present.     Mental Status: She is alert and oriented to person, place, and time.  Psychiatric:        Mood and Affect: Mood normal.        Behavior: Behavior normal.      ED Treatments / Results  Labs (all labs ordered are listed, but only abnormal results are displayed) Labs Reviewed  COMPREHENSIVE METABOLIC PANEL - Abnormal; Notable for the following components:      Result Value   Sodium 134 (*)    Chloride 96 (*)    All other components within normal limits  URINALYSIS, ROUTINE W REFLEX MICROSCOPIC - Abnormal; Notable for the following components:   Color, Urine STRAW (*)    All other components within normal limits  CBC WITH DIFFERENTIAL/PLATELET  PROTIME-INR  CBG MONITORING, ED  TROPONIN I (HIGH SENSITIVITY)  TROPONIN I (HIGH SENSITIVITY)    EKG EKG Interpretation  Date/Time:  Tuesday November 02 2018 12:21:14 EDT Ventricular Rate:  63 PR Interval:    QRS Duration: 93 QT Interval:  398 QTC Calculation: 408 R Axis:   22 Text Interpretation:  Sinus rhythm Probable left atrial enlargement Abnormal R-wave progression, early transition No significant change since last tracing Confirmed by Isla Pence [id number]) on 11/02/2018 12:30:10 PM   Radiology Dg Chest 2 View  Result Date: 11/02/2018 CLINICAL DATA:   Fall, was sitting at the table, soft black dots then passed out, woke up on ground EXAM: CHEST - 2 VIEW COMPARISON:  01/26/2017 FINDINGS: Normal heart size, mediastinal contours, and  pulmonary vascularity. Tortuous aorta. Emphysematous changes without infiltrate, pleural effusion or pneumothorax. Bones demineralized. IMPRESSION: Emphysematous changes without acute abnormality. Electronically Signed   By: Ulyses SouthwardMark  Boles M.D.   On: 11/02/2018 12:44   Ct Head Wo Contrast  Result Date: 11/02/2018 CLINICAL DATA:  Altered level of consciousness. Syncopal episode. EXAM: CT HEAD WITHOUT CONTRAST TECHNIQUE: Contiguous axial images were obtained from the base of the skull through the vertex without intravenous contrast. COMPARISON:  None. FINDINGS: Brain: There is no evidence of acute infarct, intracranial hemorrhage, mass, midline shift, or extra-axial fluid collection. Mild cerebral atrophy is within normal limits for age. Patchy cerebral white matter hypodensities are nonspecific but compatible with chronic small vessel ischemic disease, mild-to-moderate for age. Vascular: Calcified atherosclerosis at the skull base. No hyperdense vessel. Skull: No fracture or focal osseous lesion. Sinuses/Orbits: Visualized paranasal sinuses and mastoid air cells are clear. Cataract extraction. Left staphyloma. Other: None. IMPRESSION: 1. No evidence of acute intracranial abnormality. 2. Mild-to-moderate chronic small vessel ischemic disease. Electronically Signed   By: Sebastian AcheAllen  Grady M.D.   On: 11/02/2018 15:09   Dg Foot Complete Left  Result Date: 11/02/2018 CLINICAL DATA:  Left foot pain following a fall today. EXAM: LEFT FOOT - COMPLETE 3+ VIEW COMPARISON:  05/03/2013 elsewhere. No available report. FINDINGS: Diffuse osteopenia. Interval cortical irregularity in the distal shaft of the 5th metatarsal with minimal visible fracture lines medially and dorsally. Reversal of the normal plantar arch is again demonstrated with an old  talus fracture with nonunion or incomplete union. First metatarsal/tarsal degenerative changes and medial tarsal degenerative changes. Arterial calcifications. IMPRESSION: 1. Acute or subacute distal 5th metatarsal fracture. 2. Chronic post traumatic and degenerative changes. Electronically Signed   By: Beckie SaltsSteven  Reid M.D.   On: 11/02/2018 13:00    Procedures Procedures (including critical care time)  Medications Ordered in ED Medications  sodium chloride 0.9 % bolus 1,000 mL (0 mLs Intravenous Stopped 11/02/18 1559)    And  0.9 %  sodium chloride infusion (has no administration in time range)     Initial Impression / Assessment and Plan / ED Course  I have reviewed the triage vital signs and the nursing notes.  Pertinent labs & imaging results that were available during my care of the patient were reviewed by me and considered in my medical decision making (see chart for details).        I think syncopal episode is from standing up too quickly.  2 troponins neg.  EKG unchanged.  Pt is feeling better after IVFs.  She does have a 5th metatarsal fx that looks subacute on xray.  She has orthopedic shoes at home.  She is told to wear those.  [facility] and is told to f/u with them.  She also knows to f/u with her pcp.  Return if worse.  Final Clinical Impressions(s) / ED Diagnoses   Final diagnoses:  Orthostatic hypotension  Dehydration  Closed nondisplaced fracture of fifth metatarsal bone of left foot, initial encounter    ED Discharge Orders    None       Jacalyn LefevreHaviland, Alease Fait, MD 11/02/18 1818

## 2018-11-03 ENCOUNTER — Ambulatory Visit (INDEPENDENT_AMBULATORY_CARE_PROVIDER_SITE_OTHER): Payer: Medicare HMO

## 2018-11-03 ENCOUNTER — Ambulatory Visit: Payer: Medicare HMO | Admitting: Podiatry

## 2018-11-03 ENCOUNTER — Other Ambulatory Visit: Payer: Self-pay | Admitting: Podiatry

## 2018-11-03 ENCOUNTER — Other Ambulatory Visit: Payer: Self-pay

## 2018-11-03 VITALS — Temp 97.6°F

## 2018-11-03 DIAGNOSIS — M79672 Pain in left foot: Secondary | ICD-10-CM | POA: Diagnosis not present

## 2018-11-03 DIAGNOSIS — S92505A Nondisplaced unspecified fracture of left lesser toe(s), initial encounter for closed fracture: Secondary | ICD-10-CM

## 2018-11-03 NOTE — Progress Notes (Signed)
Subjective:   Patient ID: [name] [name], female   DOB: 85 y.o.   MRN: [id number]   HPI Patient presents stating that she traumatized her left foot yesterday and fell that she is concerned she broke her toe   ROS      Objective:  Physical Exam  Neurovascular status intact negative Homans sign noted with patient's left fifth digit fifth metatarsal being mildly to moderately edematous and discomforting more around the head of the fifth metatarsal      Assessment:  Possibility for fracture left fifth metatarsal secondary to trauma     Plan:  H&P condition reviewed x-ray reviewed with patient.  Advised on wearing her open toed shoes and that this should gradually get better will be seen back if symptoms persist  X-rays were negative for signs of fracture and it appears to be soft tissue

## 2018-11-09 DIAGNOSIS — R55 Syncope and collapse: Secondary | ICD-10-CM | POA: Diagnosis not present

## 2018-11-09 DIAGNOSIS — I10 Essential (primary) hypertension: Secondary | ICD-10-CM | POA: Diagnosis not present

## 2018-12-27 DIAGNOSIS — Z23 Encounter for immunization: Secondary | ICD-10-CM | POA: Diagnosis not present

## 2018-12-27 DIAGNOSIS — I10 Essential (primary) hypertension: Secondary | ICD-10-CM | POA: Diagnosis not present

## 2018-12-27 DIAGNOSIS — D649 Anemia, unspecified: Secondary | ICD-10-CM | POA: Diagnosis not present

## 2019-01-07 ENCOUNTER — Other Ambulatory Visit: Payer: Self-pay | Admitting: Family Medicine

## 2019-01-07 DIAGNOSIS — Z1231 Encounter for screening mammogram for malignant neoplasm of breast: Secondary | ICD-10-CM

## 2019-01-25 DIAGNOSIS — H20023 Recurrent acute iridocyclitis, bilateral: Secondary | ICD-10-CM | POA: Diagnosis not present

## 2019-01-25 DIAGNOSIS — H40023 Open angle with borderline findings, high risk, bilateral: Secondary | ICD-10-CM | POA: Diagnosis not present

## 2019-02-01 DIAGNOSIS — R69 Illness, unspecified: Secondary | ICD-10-CM | POA: Diagnosis not present

## 2019-02-22 ENCOUNTER — Other Ambulatory Visit: Payer: Self-pay

## 2019-02-22 ENCOUNTER — Ambulatory Visit
Admission: RE | Admit: 2019-02-22 | Discharge: 2019-02-22 | Disposition: A | Discharge status: Home / Self Care | Payer: Medicare HMO | Source: Ambulatory Visit | Attending: Family Medicine | Admitting: Family Medicine

## 2019-02-22 ENCOUNTER — Other Ambulatory Visit: Payer: Self-pay | Admitting: Family Medicine

## 2019-02-22 DIAGNOSIS — Z1231 Encounter for screening mammogram for malignant neoplasm of breast: Secondary | ICD-10-CM

## 2019-04-21 DIAGNOSIS — Z7189 Other specified counseling: Secondary | ICD-10-CM | POA: Diagnosis not present

## 2019-04-21 DIAGNOSIS — I10 Essential (primary) hypertension: Secondary | ICD-10-CM | POA: Diagnosis not present

## 2019-04-21 DIAGNOSIS — K219 Gastro-esophageal reflux disease without esophagitis: Secondary | ICD-10-CM | POA: Diagnosis not present

## 2019-04-21 DIAGNOSIS — M79673 Pain in unspecified foot: Secondary | ICD-10-CM | POA: Diagnosis not present

## 2019-04-21 DIAGNOSIS — R4789 Other speech disturbances: Secondary | ICD-10-CM | POA: Diagnosis not present

## 2019-04-21 DIAGNOSIS — J309 Allergic rhinitis, unspecified: Secondary | ICD-10-CM | POA: Diagnosis not present

## 2019-06-07 ENCOUNTER — Other Ambulatory Visit: Payer: Self-pay

## 2019-06-07 ENCOUNTER — Ambulatory Visit: Payer: Medicare HMO | Admitting: Sports Medicine

## 2019-06-07 ENCOUNTER — Encounter: Payer: Self-pay | Admitting: Sports Medicine

## 2019-06-07 DIAGNOSIS — M2041 Other hammer toe(s) (acquired), right foot: Secondary | ICD-10-CM

## 2019-06-07 DIAGNOSIS — M2042 Other hammer toe(s) (acquired), left foot: Secondary | ICD-10-CM | POA: Diagnosis not present

## 2019-06-07 DIAGNOSIS — L84 Corns and callosities: Secondary | ICD-10-CM

## 2019-06-07 DIAGNOSIS — M79671 Pain in right foot: Secondary | ICD-10-CM | POA: Diagnosis not present

## 2019-06-07 DIAGNOSIS — M2142 Flat foot [pes planus] (acquired), left foot: Secondary | ICD-10-CM | POA: Diagnosis not present

## 2019-06-07 DIAGNOSIS — M21619 Bunion of unspecified foot: Secondary | ICD-10-CM

## 2019-06-07 DIAGNOSIS — M2141 Flat foot [pes planus] (acquired), right foot: Secondary | ICD-10-CM

## 2019-06-07 DIAGNOSIS — M79672 Pain in left foot: Secondary | ICD-10-CM

## 2019-06-07 NOTE — Patient Instructions (Signed)
Recommend get Voltaren or Blue Emu for the foot pain over the counter at [employer] or [employer], Adult  Normally, a foot has a curve, called an arch, on its inner side. The arch creates a gap between the foot and the ground. Flat feet is a common condition in which one or both feet do not have an arch. What are the causes? This condition may be caused by:  Failure of a normal arch to develop during childhood.  An injury to tendons and ligaments in the foot, such as to the tendon that supports the arch (posterior tibial tendon).  Loose tendons or ligaments in the foot.  A wearing down of the arch over time.  Injury to bones in the foot.  An abnormality in the bones of the foot, called tarsal coalition. This happens when two or more bones in the foot are joined together (fused) before birth. What increases the risk? This condition is more likely to develop in:  Females.  Adults age 84 or older.  People who: ? Have a family history of flat feet. ? Have a history of childhood flexible flatfoot. ? Are obese. ? Have diabetes. ? Have high blood pressure. ? Participate in high-impact sports. ? Have inflammatory arthritis. ? Have a history of broken (fractured) or dislocated bones in the foot. What are the signs or symptoms? Symptoms of this condition include:  Pain or tightness along the bottom of the foot.  Foot pain that gets worse with activity.  Swelling of the inner side of the foot.  Swelling of the ankle.  Pain on the outer side of the ankle.  Changes in the way that you walk (gait).  Pronation. This is when the foot and ankle lean inward when you are standing.  Bony bumps on the top or inner side of the foot. How is this diagnosed? This condition is diagnosed with a physical exam of your foot and ankle. Your health care provider may also:  Look at your shoes for patterns of wear on the soles.  Order imaging tests, such as X-rays, a CT scan, or an  MRI.  Refer you to a health care provider who specializes in feet (podiatrist) or a physical therapist. How is this treated? This condition may be treated with:  Stretching exercises or physical therapy. This helps to increase range of motion and relieve pain.  A shoe insert (orthotic). This helps to support the arch of your foot. Orthotics can be purchased from a store or can be custom-made by your health care provider.  Wearing shoes with appropriate arch support. This is especially important for athletes.  Medicines. These may be prescribed to relieve pain.  An ankle brace, boot, or cast. These may be used to relieve pressure on your foot. You may be given crutches if walking is painful.  Surgery. This may be done to improve the alignment of your foot. This is only needed if your posterior tibial tendon is torn or if you have tarsal coalition. Follow these instructions at home: Activity  Do any exercises as told by your health care provider.  If an activity causes pain, avoid it or try to find another activity that does not cause pain. General instructions  Wear orthotics and appropriate shoes as told by your health care provider.  Take over-the-counter and prescription medicines only as told by your health care provider.  Wear an ankle brace, boot, or cast as told by your health care provider.  Use  crutches as told by your health care provider.  Keep all follow-up visits as told by your health care provider. This is important. How is this prevented? To prevent the condition from getting worse:  Wear comfortable, supportive shoes that are appropriate for your activities.  Maintain a healthy weight.  Stay active in a way that your health care provider recommends. This will help to keep your feet flexible and strong.  Manage long-term (chronic) health conditions, such as diabetes, high blood pressure, and inflammatory arthritis.  Work with a health care provider if you  have concerns about your feet or shoes. Contact a health care provider if:  You have pain in your foot or lower leg that gets worse or does not improve with medicine.  You have pain or difficulty when walking.  You have problems with your orthotics. Summary  Flat feet is a common condition in which one or both feet do not have a curve, called an arch, on the inner side.  Your health care provider may recommend a shoe insert (orthotic) or shoes with the appropriate arch support.  Other treatments may include stretching exercises or physical therapy, medicines to relieve pain, and wearing an ankle brace, boot, or cast.  Surgery may be done if you have a tear in the tendon that supports your arch (posterior tibial tendon) or if two or more of your foot bones were joined together (fused)  before birth (tarsal coalition). This information is not intended to replace advice given to you by your health care provider. Make sure you discuss any questions you have with your health care provider. Document Revised: 07/22/2018 Document Reviewed: 06/11/2016 Elsevier Patient Education  [place].

## 2019-06-07 NOTE — Progress Notes (Signed)
Subjective: [name] [name] is a 86 y.o. female patient who presents to office for evaluation of left and right foot pain. Patient complains of progressive pain especially over the last 8 months in the feet all over. Ranks pain 6/10 and is now interferring with daily activities. Patient has tried many shoes, cushsions, braces, and goes for pedicures with no relief in symptoms. Reports that her 5th toe still hurts after she bumped it back in July Patient denies any other pedal complaints. Denies new injury/trip/fall/sprain/any causative factors.   Review of Systems  All other systems reviewed and are negative.    There are no problems to display for this patient.   Current Outpatient Medications on File Prior to Visit  Medication Sig Dispense Refill  . acetaminophen (TYLENOL) 500 MG tablet Take 1 tablet (500 mg total) by mouth every 6 (six) hours as needed. (Patient taking differently: Take 500 mg by mouth every 6 (six) hours as needed for mild pain. ) 30 tablet 0  . Artificial Tear Ointment (ARTIFICIAL TEARS) ointment Place 1 drop into both eyes as needed (dry eyes).     Marland Kitchen aspirin EC 325 MG tablet Take 1 tablet (325 mg total) by mouth daily. 30 tablet 0  . Cholecalciferol (VITAMIN D PO) Take 1,000 Units by mouth daily.     . ferrous sulfate 325 (65 FE) MG EC tablet Take 325 mg by mouth daily.    . fluticasone (FLONASE) 50 MCG/ACT nasal spray Place into both nostrils daily.    . hydrochlorothiazide (HYDRODIURIL) 12.5 MG tablet Take 12.5 mg by mouth every morning.    . polyethylene glycol (MIRALAX / GLYCOLAX) 17 g packet Take 17 g by mouth daily as needed for mild constipation.    . prednisoLONE acetate (PRED FORTE) 1 % ophthalmic suspension Place 1 drop into the left eye daily.      No current facility-administered medications on file prior to visit.    Allergies  Allergen Reactions  . Risedronate Sodium     Other reaction(s): Dizziness    Objective:  General: Alert and oriented x3  in no acute distress  Dermatology: Callus plantar medial foot bilateral x2 and medial bunion on right, No open lesions bilateral lower extremities, no webspace macerations, no ecchymosis bilateral, all nails x 10 are well manicured.  Vascular: Dorsalis Pedis and Posterior Tibial pedal pulses faintly palpable 1/4, Capillary Fill Time 5 seconds,(+) pedal hair growth bilateral, no edema bilateral lower extremities, Temperature gradient within normal limits.  Neurology: Gross sensation intact via light touch bilateral.   Musculoskeletal: Mild tenderness with palpation diffusely to both feet and ankles. Severe pes planus with ankle valgus, semirigid in nature, Hammertoe and bunion deformity.   Gait: Antalgic gait unassisted   Assessment and Plan: Problem List Items Addressed This Visit    None    Visit Diagnoses    Pes planus of both feet    -  Primary   Callus       Hammer toes of both feet       Bunion       Foot pain, bilateral           -Complete examination performed -Previous Xrays reviewed -Discussed treatement options for callus and severe foot deformity -At no charge debrided calluses using sterile chisel blade x5 without incident  -Rx Foot miracle to use to help keep callus areas soft -Advised patient to get Blue Emu or Voltaren topical to use daily as needed for foot pain -Continue with New  Balance 990 v4 shoes since they are most comfortable, patient may benefit from assessment for orthotics vs shoe recs with [name] or Fenton Foy; patient to schedule this appointment if she prefers  -Patient to return to office as needed or sooner if condition worsens.  Asencion Islam, DPM

## 2019-06-16 ENCOUNTER — Telehealth: Payer: Self-pay | Admitting: *Deleted

## 2019-06-16 NOTE — Telephone Encounter (Signed)
Called patient-  She was unsure why she was to take Aspirin daily as instructed from her medication list. I advised this medication appears to have been prescribed in 2018. She states she does take the medication, but only every now and then.   Also, did not know what ferrous sulfate was and I explained this was Iron and she does still take this as well.

## 2019-06-16 NOTE — Telephone Encounter (Signed)
Patient called stating she didn't know why she had aspirin and some type of sulfa drug listed in her medication list. Please call.

## 2019-06-16 NOTE — Telephone Encounter (Signed)
Attempted to call - Unable to leave message - voicemail not set up

## 2019-06-16 NOTE — Telephone Encounter (Signed)
Patient has questions regarding shoe appointment tomorrow-  #1 Would like to know how much the shoes are?  #2 How long will the appointment take to do measurements?

## 2019-06-17 ENCOUNTER — Other Ambulatory Visit: Payer: Self-pay

## 2019-06-17 ENCOUNTER — Other Ambulatory Visit: Payer: Medicare HMO | Admitting: Orthotics

## 2019-06-17 DIAGNOSIS — M2142 Flat foot [pes planus] (acquired), left foot: Secondary | ICD-10-CM | POA: Diagnosis not present

## 2019-06-17 DIAGNOSIS — M2141 Flat foot [pes planus] (acquired), right foot: Secondary | ICD-10-CM | POA: Diagnosis not present

## 2019-06-17 DIAGNOSIS — M21619 Bunion of unspecified foot: Secondary | ICD-10-CM | POA: Diagnosis not present

## 2019-07-04 ENCOUNTER — Other Ambulatory Visit: Payer: Self-pay

## 2019-07-04 ENCOUNTER — Ambulatory Visit: Payer: Medicare HMO | Admitting: Orthotics

## 2019-07-04 DIAGNOSIS — M2141 Flat foot [pes planus] (acquired), right foot: Secondary | ICD-10-CM

## 2019-07-04 DIAGNOSIS — M2042 Other hammer toe(s) (acquired), left foot: Secondary | ICD-10-CM

## 2019-07-04 DIAGNOSIS — L84 Corns and callosities: Secondary | ICD-10-CM

## 2019-07-04 DIAGNOSIS — M2142 Flat foot [pes planus] (acquired), left foot: Secondary | ICD-10-CM

## 2019-07-04 DIAGNOSIS — M2041 Other hammer toe(s) (acquired), right foot: Secondary | ICD-10-CM

## 2019-07-04 NOTE — Progress Notes (Signed)
Patient came in today to pick up custom made foot orthotics.  The goals were accomplished and the patient reported no dissatisfaction with said orthotics.  Patient was advised of breakin period and how to report any issues. 

## 2019-07-26 DIAGNOSIS — H0520 Unspecified exophthalmos: Secondary | ICD-10-CM | POA: Diagnosis not present

## 2019-07-26 DIAGNOSIS — H40023 Open angle with borderline findings, high risk, bilateral: Secondary | ICD-10-CM | POA: Diagnosis not present

## 2019-07-26 DIAGNOSIS — H20023 Recurrent acute iridocyclitis, bilateral: Secondary | ICD-10-CM | POA: Diagnosis not present

## 2019-07-26 DIAGNOSIS — H524 Presbyopia: Secondary | ICD-10-CM | POA: Diagnosis not present

## 2019-07-26 DIAGNOSIS — Z961 Presence of intraocular lens: Secondary | ICD-10-CM | POA: Diagnosis not present

## 2019-08-02 ENCOUNTER — Other Ambulatory Visit: Payer: Self-pay

## 2019-08-02 ENCOUNTER — Encounter: Payer: Self-pay | Admitting: Sports Medicine

## 2019-08-02 ENCOUNTER — Ambulatory Visit: Payer: Medicare HMO | Admitting: Sports Medicine

## 2019-08-02 VITALS — Temp 96.2°F

## 2019-08-02 DIAGNOSIS — M779 Enthesopathy, unspecified: Secondary | ICD-10-CM

## 2019-08-02 DIAGNOSIS — M2142 Flat foot [pes planus] (acquired), left foot: Secondary | ICD-10-CM

## 2019-08-02 DIAGNOSIS — M2041 Other hammer toe(s) (acquired), right foot: Secondary | ICD-10-CM

## 2019-08-02 DIAGNOSIS — M79671 Pain in right foot: Secondary | ICD-10-CM

## 2019-08-02 DIAGNOSIS — M2141 Flat foot [pes planus] (acquired), right foot: Secondary | ICD-10-CM

## 2019-08-02 DIAGNOSIS — M778 Other enthesopathies, not elsewhere classified: Secondary | ICD-10-CM

## 2019-08-02 DIAGNOSIS — M79672 Pain in left foot: Secondary | ICD-10-CM

## 2019-08-02 DIAGNOSIS — M2042 Other hammer toe(s) (acquired), left foot: Secondary | ICD-10-CM

## 2019-08-02 DIAGNOSIS — L84 Corns and callosities: Secondary | ICD-10-CM

## 2019-08-02 MED ORDER — TRIAMCINOLONE ACETONIDE 10 MG/ML IJ SUSP: 10.0000 mg | Freq: Once | INTRAMUSCULAR | Status: AC

## 2019-08-02 NOTE — Progress Notes (Signed)
Subjective: [name] [name] is a 86 y.o. female patient who presents to office for evaluation of left and right foot pain. Patient complains of progressive pain especially over the last month in the feet all over with sharp pains even while sitting worse on the right at the medial foot and ankle. Reports that she wants her left 2nd toe corn trimmed as well. Patient denies any other pedal complaints.  There are no problems to display for this patient.   Current Outpatient Medications on File Prior to Visit  Medication Sig Dispense Refill  . acetaminophen (TYLENOL) 500 MG tablet Take 1 tablet (500 mg total) by mouth every 6 (six) hours as needed. (Patient taking differently: Take 500 mg by mouth every 6 (six) hours as needed for mild pain. ) 30 tablet 0  . Artificial Tear Ointment (ARTIFICIAL TEARS) ointment Place 1 drop into both eyes as needed (dry eyes).     Marland Kitchen aspirin EC 325 MG tablet Take 1 tablet (325 mg total) by mouth daily. 30 tablet 0  . Cholecalciferol (VITAMIN D PO) Take 1,000 Units by mouth daily.     . ferrous sulfate 325 (65 FE) MG EC tablet Take 325 mg by mouth daily.    . fluticasone (FLONASE) 50 MCG/ACT nasal spray Place into both nostrils daily.    . hydrochlorothiazide (HYDRODIURIL) 12.5 MG tablet Take 12.5 mg by mouth every morning.    . polyethylene glycol (MIRALAX / GLYCOLAX) 17 g packet Take 17 g by mouth daily as needed for mild constipation.    . prednisoLONE acetate (PRED FORTE) 1 % ophthalmic suspension Place 1 drop into the left eye daily.      No current facility-administered medications on file prior to visit.    Allergies  Allergen Reactions  . Risedronate Sodium     Other reaction(s): Dizziness    Objective:  General: Alert and oriented x3 in no acute distress  Dermatology: Callus plantar medial foot bilateral x2 and medial left 2nd toe. No open lesions bilateral lower extremities, no webspace macerations, no ecchymosis bilateral, all nails x 10 are well  manicured.  Vascular: Dorsalis Pedis and Posterior Tibial pedal pulses faintly palpable 1/4, Capillary Fill Time 5 seconds,(+) pedal hair growth bilateral, no edema bilateral lower extremities, Temperature gradient within normal limits.  Neurology: Johney Maine sensation intact via light touch bilateral.   Musculoskeletal: Mild tenderness with palpation diffusely to both feet and ankles worse pain today noted at PT tendon course at Right foot/ankle. Severe pes planus with ankle valgus, semirigid in nature, Hammertoe and bunion deformity.   Assessment and Plan: Problem List Items Addressed This Visit    None    Visit Diagnoses    Tendonitis    -  Primary   R>L at PT Tendon   Pes planus of both feet       Callus       Hammer toes of both feet       Foot pain, bilateral           -Complete examination performed -Previous Xrays reviewed -Discussed treatement options for callus/corn and tendonitis and severe foot deformity -At no charge debrided calluses using sterile chisel blade x1 at left 2nd toe without incident  -Dispensed toe cap for left 2nd toe  -After oral consent and aseptic prep, injected a mixture containing 1 ml of 2%  plain lidocaine, 1 ml 0.5% plain marcaine, 0.5 ml of kenalog 10 and 0.5 ml of dexamethasone phosphate into Right medial foot/ankle at PT  tendon without complication. Post-injection care discussed with patient.  -Advised patient use Voltaren topical to use daily as needed for foot pain -Continue with New Balance 990 v4 shoes since they are most comfortable like before and insoles -Patient to return to office as needed or sooner if condition worsens.  Asencion Islam, DPM

## 2019-08-02 NOTE — Patient Instructions (Signed)
Recommend to ice for 20 minutes at the right and left foot and ankle at least once daily starting today Recommend to try topical pain cream or rub/Voltaren in a small area for the first day if it is helpful may increase the usage to 3 times a day Recommend patient to return to office after 2 weeks if pain fails to continue to improve

## 2019-08-04 DIAGNOSIS — R69 Illness, unspecified: Secondary | ICD-10-CM | POA: Diagnosis not present

## 2019-08-09 ENCOUNTER — Telehealth: Payer: Self-pay | Admitting: *Deleted

## 2019-08-09 NOTE — Telephone Encounter (Signed)
Unable to leave a message voice mailbox has not been set up. 

## 2019-08-09 NOTE — Telephone Encounter (Signed)
Pt states she received a cortisone shot in the right foot and was washing it in epsom salt and using the voltaren gel once daily, walking in the parking lot was better but walking on the carpet hurt.

## 2019-08-25 DIAGNOSIS — H40023 Open angle with borderline findings, high risk, bilateral: Secondary | ICD-10-CM | POA: Diagnosis not present

## 2019-08-25 DIAGNOSIS — H20023 Recurrent acute iridocyclitis, bilateral: Secondary | ICD-10-CM | POA: Diagnosis not present

## 2019-08-29 DIAGNOSIS — H20023 Recurrent acute iridocyclitis, bilateral: Secondary | ICD-10-CM | POA: Diagnosis not present

## 2019-09-20 ENCOUNTER — Ambulatory Visit: Payer: Medicare HMO | Admitting: Sports Medicine

## 2019-09-20 ENCOUNTER — Encounter: Payer: Self-pay | Admitting: Sports Medicine

## 2019-09-20 ENCOUNTER — Other Ambulatory Visit: Payer: Self-pay

## 2019-09-20 VITALS — Temp 98.0°F

## 2019-09-20 DIAGNOSIS — M79671 Pain in right foot: Secondary | ICD-10-CM | POA: Diagnosis not present

## 2019-09-20 DIAGNOSIS — M2142 Flat foot [pes planus] (acquired), left foot: Secondary | ICD-10-CM | POA: Diagnosis not present

## 2019-09-20 DIAGNOSIS — M779 Enthesopathy, unspecified: Secondary | ICD-10-CM

## 2019-09-20 DIAGNOSIS — M79672 Pain in left foot: Secondary | ICD-10-CM | POA: Diagnosis not present

## 2019-09-20 DIAGNOSIS — M2141 Flat foot [pes planus] (acquired), right foot: Secondary | ICD-10-CM

## 2019-09-20 DIAGNOSIS — L84 Corns and callosities: Secondary | ICD-10-CM

## 2019-09-20 NOTE — Progress Notes (Signed)
Subjective: [name] [name] is a 86 y.o. female patient who returns to office for follow-up evaluation of bilateral foot pain reports that the right hurts more as sharp pains that occasionally go through the medial side of the arch but does feel better than before reports that the injection in the past has helped but the Voltaren gel did not do anything for her pain or symptoms.  Patient denies any increase in swelling redness or warmth to the affected areas.  Reports that she wants her left 2nd toe corn trimmed as well because it is starting to rub and get a little sore. Patient denies any other pedal complaints.  There are no problems to display for this patient.   Current Outpatient Medications on File Prior to Visit  Medication Sig Dispense Refill  . acetaminophen (TYLENOL) 500 MG tablet Take 1 tablet (500 mg total) by mouth every 6 (six) hours as needed. (Patient taking differently: Take 500 mg by mouth every 6 (six) hours as needed for mild pain. ) 30 tablet 0  . ADVIL 200 MG CAPS Take 200 mg by mouth as needed.    . Artificial Tear Ointment (ARTIFICIAL TEARS) ointment Place 1 drop into both eyes as needed (dry eyes).     Marland Kitchen aspirin EC 325 MG tablet Take 1 tablet (325 mg total) by mouth daily. 30 tablet 0  . Cholecalciferol (VITAMIN D PO) Take 1,000 Units by mouth daily.     . ferrous sulfate 325 (65 FE) MG EC tablet Take 325 mg by mouth daily.    . fluticasone (FLONASE) 50 MCG/ACT nasal spray Place into both nostrils daily.    . hydrochlorothiazide (HYDRODIURIL) 12.5 MG tablet Take 12.5 mg by mouth every morning.    . polyethylene glycol (MIRALAX / GLYCOLAX) 17 g packet Take 17 g by mouth daily as needed for mild constipation.    . Polyvinyl Alcohol-Povidone PF (REFRESH) 1.4-0.6 % SOLN Apply 1 drop to eye as needed. Refresh    . prednisoLONE acetate (PRED FORTE) 1 % ophthalmic suspension Place 1 drop into the left eye daily.      Current Facility-Administered Medications on File Prior to  Visit  Medication Dose Route Frequency Provider Last Rate Last Admin  . triamcinolone acetonide (KENALOG) 10 MG/ML injection 10 mg  10 mg Other Once Landis Martins, DPM        Allergies  Allergen Reactions  . Risedronate Sodium     Other reaction(s): Dizziness    Objective:  General: Alert and oriented x3 in no acute distress  Dermatology: Reactive callus plantar medial foot bilateral x2 and medial left 2nd toe. No open lesions bilateral lower extremities, no webspace macerations, no ecchymosis bilateral, all nails x 10 are well manicured.  Vascular: Dorsalis Pedis and Posterior Tibial pedal pulses faintly palpable 1/4, Capillary Fill Time 5 seconds,(+) pedal hair growth bilateral, no edema bilateral lower extremities, Temperature gradient within normal limits.  Neurology: Johney Maine sensation intact via light touch bilateral.   Musculoskeletal: Mild tenderness with palpation diffusely to both feet and ankles with pain along the posterior tibial tendon not as bad as previous with occasional sharp shooting pains that randomly happen to the day and the area that has decreased in frequency according to patient.  Severe pes planus with ankle valgus, semirigid in nature, Hammertoe and bunion deformity.   Assessment and Plan: Problem List Items Addressed This Visit    None    Visit Diagnoses    Tendonitis    -  Primary  Pes planus of both feet       Callus       Foot pain, bilateral           -Complete examination performed -Discussed treatement options for callus/corn and tendonitis and severe foot deformity and explained to patient that she will always have foot pain due to the rigid nature of her deformity and at her age that there is no surgical option -At no charge debrided symptomatic callus using sterile chisel blade x1 at left 2nd toe without incident  -Re-Dispensed toe cap for left 2nd toe to use as instructed like previous -No reinjection given at this time at the right foot at  the posterior tibial tendon due to improving pain and symptoms advised patient to continue doing what she has been doing to help with any pain in the areas and advised patient that random sharp shooting pain could be related to inflammation on her nerve in the area -Continue with good supportive shoes daily and Plastizote line insoles advised patient that she may need her insoles replaced after 1 year otherwise her current insoles seems like they are providing the adequate support -Patient to return to office as needed or sooner if condition worsens.  Asencion Islam, DPM

## 2019-09-22 ENCOUNTER — Telehealth: Payer: Self-pay | Admitting: *Deleted

## 2019-09-22 NOTE — Telephone Encounter (Signed)
Left message informing pt of Dr. Wynema Birch statement.

## 2019-09-22 NOTE — Telephone Encounter (Signed)
Pt asked if the Copper Fit compression hose would work for her.

## 2019-09-22 NOTE — Telephone Encounter (Signed)
Yes she can use copper fit compression

## 2019-10-20 DIAGNOSIS — H20023 Recurrent acute iridocyclitis, bilateral: Secondary | ICD-10-CM | POA: Diagnosis not present

## 2019-10-20 DIAGNOSIS — H40023 Open angle with borderline findings, high risk, bilateral: Secondary | ICD-10-CM | POA: Diagnosis not present

## 2019-11-07 DIAGNOSIS — I129 Hypertensive chronic kidney disease with stage 1 through stage 4 chronic kidney disease, or unspecified chronic kidney disease: Secondary | ICD-10-CM | POA: Diagnosis not present

## 2019-11-07 DIAGNOSIS — N183 Chronic kidney disease, stage 3 unspecified: Secondary | ICD-10-CM | POA: Diagnosis not present

## 2019-11-07 DIAGNOSIS — D649 Anemia, unspecified: Secondary | ICD-10-CM | POA: Diagnosis not present

## 2019-11-07 DIAGNOSIS — M1990 Unspecified osteoarthritis, unspecified site: Secondary | ICD-10-CM | POA: Diagnosis not present

## 2019-11-07 DIAGNOSIS — J309 Allergic rhinitis, unspecified: Secondary | ICD-10-CM | POA: Diagnosis not present

## 2019-11-07 DIAGNOSIS — E559 Vitamin D deficiency, unspecified: Secondary | ICD-10-CM | POA: Diagnosis not present

## 2019-11-07 DIAGNOSIS — K219 Gastro-esophageal reflux disease without esophagitis: Secondary | ICD-10-CM | POA: Diagnosis not present

## 2019-11-07 DIAGNOSIS — H35033 Hypertensive retinopathy, bilateral: Secondary | ICD-10-CM | POA: Diagnosis not present

## 2019-11-07 DIAGNOSIS — Z0000 Encounter for general adult medical examination without abnormal findings: Secondary | ICD-10-CM | POA: Diagnosis not present

## 2019-11-07 DIAGNOSIS — M810 Age-related osteoporosis without current pathological fracture: Secondary | ICD-10-CM | POA: Diagnosis not present

## 2019-12-27 DIAGNOSIS — R42 Dizziness and giddiness: Secondary | ICD-10-CM | POA: Diagnosis not present

## 2019-12-27 DIAGNOSIS — Z23 Encounter for immunization: Secondary | ICD-10-CM | POA: Diagnosis not present

## 2019-12-27 DIAGNOSIS — I129 Hypertensive chronic kidney disease with stage 1 through stage 4 chronic kidney disease, or unspecified chronic kidney disease: Secondary | ICD-10-CM | POA: Diagnosis not present

## 2019-12-29 ENCOUNTER — Ambulatory Visit (INDEPENDENT_AMBULATORY_CARE_PROVIDER_SITE_OTHER): Payer: Medicare HMO | Admitting: Sports Medicine

## 2019-12-29 ENCOUNTER — Encounter: Payer: Self-pay | Admitting: Sports Medicine

## 2019-12-29 ENCOUNTER — Other Ambulatory Visit: Payer: Self-pay

## 2019-12-29 DIAGNOSIS — M2142 Flat foot [pes planus] (acquired), left foot: Secondary | ICD-10-CM | POA: Diagnosis not present

## 2019-12-29 DIAGNOSIS — M2141 Flat foot [pes planus] (acquired), right foot: Secondary | ICD-10-CM | POA: Diagnosis not present

## 2019-12-29 DIAGNOSIS — M79672 Pain in left foot: Secondary | ICD-10-CM | POA: Diagnosis not present

## 2019-12-29 DIAGNOSIS — M779 Enthesopathy, unspecified: Secondary | ICD-10-CM

## 2019-12-29 DIAGNOSIS — M79674 Pain in right toe(s): Secondary | ICD-10-CM

## 2019-12-29 DIAGNOSIS — M79675 Pain in left toe(s): Secondary | ICD-10-CM

## 2019-12-29 DIAGNOSIS — M79671 Pain in right foot: Secondary | ICD-10-CM

## 2019-12-29 DIAGNOSIS — L84 Corns and callosities: Secondary | ICD-10-CM

## 2019-12-29 DIAGNOSIS — B351 Tinea unguium: Secondary | ICD-10-CM

## 2019-12-29 NOTE — Progress Notes (Signed)
Subjective: [name] [name] is a 86 y.o. female patient who returns to office for follow-up evaluation of bilateral foot pain reports that the right hurts more as sharp pains that occasionally go through the medial side of the arch with callus and Reports that she wants her nails trimmed as well. Patient denies any other pedal complaints.  There are no problems to display for this patient.   Current Outpatient Medications on File Prior to Visit  Medication Sig Dispense Refill  . acetaminophen (TYLENOL) 500 MG tablet Take 1 tablet (500 mg total) by mouth every 6 (six) hours as needed. (Patient taking differently: Take 500 mg by mouth every 6 (six) hours as needed for mild pain. ) 30 tablet 0  . ADVIL 200 MG CAPS Take 200 mg by mouth as needed.    . Artificial Tear Ointment (ARTIFICIAL TEARS) ointment Place 1 drop into both eyes as needed (dry eyes).     Marland Kitchen aspirin EC 325 MG tablet Take 1 tablet (325 mg total) by mouth daily. 30 tablet 0  . Cholecalciferol (VITAMIN D PO) Take 1,000 Units by mouth daily.     . ferrous sulfate 325 (65 FE) MG EC tablet Take 325 mg by mouth daily.    . fluticasone (FLONASE) 50 MCG/ACT nasal spray Place into both nostrils daily.    . hydrochlorothiazide (HYDRODIURIL) 12.5 MG tablet Take 12.5 mg by mouth every morning.    . polyethylene glycol (MIRALAX / GLYCOLAX) 17 g packet Take 17 g by mouth daily as needed for mild constipation.    . Polyvinyl Alcohol-Povidone PF (REFRESH) 1.4-0.6 % SOLN Apply 1 drop to eye as needed. Refresh    . prednisoLONE acetate (PRED FORTE) 1 % ophthalmic suspension Place 1 drop into the left eye daily.      Current Facility-Administered Medications on File Prior to Visit  Medication Dose Route Frequency Provider Last Rate Last Admin  . triamcinolone acetonide (KENALOG) 10 MG/ML injection 10 mg  10 mg Other Once Asencion Islam, DPM        Allergies  Allergen Reactions  . Risedronate Sodium     Other reaction(s): Dizziness     Objective:  General: Alert and oriented x3 in no acute distress  Dermatology: Reactive callus plantar medial foot bilateral x2 and medial left 2nd toe. No open lesions bilateral lower extremities, no webspace macerations, no ecchymosis bilateral, all nails x 10 are mildly elonaged.   Vascular: Dorsalis Pedis and Posterior Tibial pedal pulses faintly palpable 1/4, Capillary Fill Time 5 seconds,(+) pedal hair growth bilateral, no edema bilateral lower extremities, Temperature gradient within normal limits.  Neurology: [name] sensation intact via light touch bilateral.   Musculoskeletal: Mild tenderness with palpation diffusely to both feet and ankles with pain along the posterior tibial tendon not as bad as previous with occasional sharp shooting pains that randomly happen to the day and the area that has decreased in frequency that is off and on according to patient.  Severe pes planus with ankle valgus, semirigid in nature, Hammertoe and bunion deformity.   Assessment and Plan: Problem List Items Addressed This Visit    None    Visit Diagnoses    Callus    -  Primary   Pain due to onychomycosis of toenails of both feet       Tendonitis       Pes planus of both feet       Foot pain, bilateral           -  Complete examination performed -Re-Discussed treatement options for callus/corn and tendonitis and severe foot deformity and explained to patient that she will always have foot pain due to the rigid nature of her deformity and at her age that there is no surgical option -At no charge debrided symptomatic callus using sterile chisel blade x1 at left 2nd toe without incident  -Advised daily emollients foot miracle or okeeffe healthy feet for callus skin -At no charged debrided nails using sterile nail nipper without incident  -No reinjection given at this time at the right foot at the posterior tibial tendon due to improving pain and symptoms advised patient to continue with good  supportive shoes daily and Plastizote line insoles advised patient that she may need her insoles replaced after 1 year like before and applied moleskin to her current devices to see if this will help with her pain  -Patient to return to office as needed or sooner if condition worsens.  Asencion Islam, DPM

## 2020-01-20 DIAGNOSIS — H40023 Open angle with borderline findings, high risk, bilateral: Secondary | ICD-10-CM | POA: Diagnosis not present

## 2020-01-20 DIAGNOSIS — H20023 Recurrent acute iridocyclitis, bilateral: Secondary | ICD-10-CM | POA: Diagnosis not present

## 2020-01-24 ENCOUNTER — Other Ambulatory Visit: Payer: Self-pay | Admitting: Family Medicine

## 2020-01-24 DIAGNOSIS — Z1231 Encounter for screening mammogram for malignant neoplasm of breast: Secondary | ICD-10-CM

## 2020-02-06 DIAGNOSIS — I129 Hypertensive chronic kidney disease with stage 1 through stage 4 chronic kidney disease, or unspecified chronic kidney disease: Secondary | ICD-10-CM | POA: Diagnosis not present

## 2020-02-23 ENCOUNTER — Ambulatory Visit
Admission: RE | Admit: 2020-02-23 | Discharge: 2020-02-23 | Disposition: A | Discharge status: Home / Self Care | Payer: Medicare HMO | Source: Ambulatory Visit | Attending: Family Medicine | Admitting: Family Medicine

## 2020-02-23 ENCOUNTER — Other Ambulatory Visit: Payer: Self-pay

## 2020-02-23 DIAGNOSIS — Z1231 Encounter for screening mammogram for malignant neoplasm of breast: Secondary | ICD-10-CM | POA: Diagnosis not present

## 2020-03-19 DIAGNOSIS — S0502XA Injury of conjunctiva and corneal abrasion without foreign body, left eye, initial encounter: Secondary | ICD-10-CM | POA: Diagnosis not present

## 2020-03-19 DIAGNOSIS — H20023 Recurrent acute iridocyclitis, bilateral: Secondary | ICD-10-CM | POA: Diagnosis not present

## 2020-03-29 DIAGNOSIS — H20023 Recurrent acute iridocyclitis, bilateral: Secondary | ICD-10-CM | POA: Diagnosis not present

## 2020-03-29 DIAGNOSIS — S0502XD Injury of conjunctiva and corneal abrasion without foreign body, left eye, subsequent encounter: Secondary | ICD-10-CM | POA: Diagnosis not present

## 2020-05-10 DIAGNOSIS — R609 Edema, unspecified: Secondary | ICD-10-CM | POA: Diagnosis not present

## 2020-05-10 DIAGNOSIS — I129 Hypertensive chronic kidney disease with stage 1 through stage 4 chronic kidney disease, or unspecified chronic kidney disease: Secondary | ICD-10-CM | POA: Diagnosis not present

## 2020-05-10 DIAGNOSIS — D649 Anemia, unspecified: Secondary | ICD-10-CM | POA: Diagnosis not present

## 2020-05-10 DIAGNOSIS — R5383 Other fatigue: Secondary | ICD-10-CM | POA: Diagnosis not present

## 2020-05-10 DIAGNOSIS — N183 Chronic kidney disease, stage 3 unspecified: Secondary | ICD-10-CM | POA: Diagnosis not present

## 2020-05-24 ENCOUNTER — Other Ambulatory Visit: Payer: Self-pay

## 2020-05-24 ENCOUNTER — Encounter: Payer: Self-pay | Admitting: Sports Medicine

## 2020-05-24 ENCOUNTER — Ambulatory Visit (INDEPENDENT_AMBULATORY_CARE_PROVIDER_SITE_OTHER): Payer: Medicare HMO | Admitting: Sports Medicine

## 2020-05-24 DIAGNOSIS — L84 Corns and callosities: Secondary | ICD-10-CM | POA: Diagnosis not present

## 2020-05-24 DIAGNOSIS — M2141 Flat foot [pes planus] (acquired), right foot: Secondary | ICD-10-CM

## 2020-05-24 DIAGNOSIS — M79675 Pain in left toe(s): Secondary | ICD-10-CM | POA: Diagnosis not present

## 2020-05-24 DIAGNOSIS — B351 Tinea unguium: Secondary | ICD-10-CM | POA: Diagnosis not present

## 2020-05-24 DIAGNOSIS — M79671 Pain in right foot: Secondary | ICD-10-CM | POA: Diagnosis not present

## 2020-05-24 DIAGNOSIS — M2142 Flat foot [pes planus] (acquired), left foot: Secondary | ICD-10-CM

## 2020-05-24 DIAGNOSIS — M79672 Pain in left foot: Secondary | ICD-10-CM

## 2020-05-24 DIAGNOSIS — M79674 Pain in right toe(s): Secondary | ICD-10-CM | POA: Diagnosis not present

## 2020-05-24 NOTE — Progress Notes (Signed)
Subjective: [name] [name] is a 87 y.o. female patient who returns to office for follow-up evaluation of bilateral foot pain reports that her calluses feels sore and he wants to discuss possible new insoles to help with the occasional pain that she has along her medial arches. Patient denies any other pedal complaints.  There are no problems to display for this patient.   Current Outpatient Medications on File Prior to Visit  Medication Sig Dispense Refill  . acetaminophen (TYLENOL) 500 MG tablet Take 1 tablet (500 mg total) by mouth every 6 (six) hours as needed. (Patient taking differently: Take 500 mg by mouth every 6 (six) hours as needed for mild pain. ) 30 tablet 0  . ADVIL 200 MG CAPS Take 200 mg by mouth as needed.    . Artificial Tear Ointment (ARTIFICIAL TEARS) ointment Place 1 drop into both eyes as needed (dry eyes).     Marland Kitchen aspirin EC 325 MG tablet Take 1 tablet (325 mg total) by mouth daily. 30 tablet 0  . Cholecalciferol (VITAMIN D PO) Take 1,000 Units by mouth daily.     . ferrous sulfate 325 (65 FE) MG EC tablet Take 325 mg by mouth daily.    . fluticasone (FLONASE) 50 MCG/ACT nasal spray Place into both nostrils daily.    . hydrochlorothiazide (HYDRODIURIL) 12.5 MG tablet Take 12.5 mg by mouth every morning.    . polyethylene glycol (MIRALAX / GLYCOLAX) 17 g packet Take 17 g by mouth daily as needed for mild constipation.    . Polyvinyl Alcohol-Povidone PF (REFRESH) 1.4-0.6 % SOLN Apply 1 drop to eye as needed. Refresh    . prednisoLONE acetate (PRED FORTE) 1 % ophthalmic suspension Place 1 drop into the left eye daily.      Current Facility-Administered Medications on File Prior to Visit  Medication Dose Route Frequency Provider Last Rate Last Admin  . triamcinolone acetonide (KENALOG) 10 MG/ML injection 10 mg  10 mg Other Once Asencion Islam, DPM        Allergies  Allergen Reactions  . Risedronate Sodium     Other reaction(s): Dizziness    Objective:  General:  Alert and oriented x3 in no acute distress  Dermatology: Reactive callus plantar medial foot bilateral x2 and medial left 2nd toe. No open lesions bilateral lower extremities, mild webspace macerations, no ecchymosis bilateral, all nails x 10 are mildly elonaged.   Vascular: Dorsalis Pedis and Posterior Tibial pedal pulses faintly palpable 1/4, Capillary Fill Time 5 seconds,(+) pedal hair growth bilateral, no edema bilateral lower extremities, Temperature gradient within normal limits.  Neurology: [name] [name] sensation intact via light touch bilateral.   Musculoskeletal: Mild tenderness with palpation diffusely to both feet and ankles with prolonged posterior tibial tendon course at medial arch.  Severe pes planus with ankle valgus, semirigid in nature, Hammertoe and bunion deformity.   Assessment and Plan: Problem List Items Addressed This Visit   None   Visit Diagnoses    Callus    -  Primary   Pain due to onychomycosis of toenails of both feet       Pes planus of both feet       Foot pain, bilateral           -Complete examination performed -Re-Discussed treatement options for callus/corn and tendonitis and severe foot deformity  -At no charge debrided symptomatic callus using sterile chisel blade x1 at left 2nd toe without incident  -Continue with daily skin emollients -At no charge debrided nails  using sterile nail nipper without incident  -Encourage patient to dry well in between toes and to use Betadine at areas of maceration -Patient to see to [name] [name] to have her Plastizote insoles modified or anesthetic done -Patient to return to office as needed or sooner if condition worsens.  Asencion Islam, DPM

## 2020-05-28 DIAGNOSIS — H40023 Open angle with borderline findings, high risk, bilateral: Secondary | ICD-10-CM | POA: Diagnosis not present

## 2020-05-28 DIAGNOSIS — H26493 Other secondary cataract, bilateral: Secondary | ICD-10-CM | POA: Diagnosis not present

## 2020-05-28 DIAGNOSIS — H0520 Unspecified exophthalmos: Secondary | ICD-10-CM | POA: Diagnosis not present

## 2020-05-28 DIAGNOSIS — H20023 Recurrent acute iridocyclitis, bilateral: Secondary | ICD-10-CM | POA: Diagnosis not present

## 2020-06-07 DIAGNOSIS — I129 Hypertensive chronic kidney disease with stage 1 through stage 4 chronic kidney disease, or unspecified chronic kidney disease: Secondary | ICD-10-CM | POA: Diagnosis not present

## 2020-06-07 DIAGNOSIS — N183 Chronic kidney disease, stage 3 unspecified: Secondary | ICD-10-CM | POA: Diagnosis not present

## 2020-06-12 ENCOUNTER — Other Ambulatory Visit: Payer: Self-pay

## 2020-06-12 ENCOUNTER — Ambulatory Visit: Payer: Medicare HMO

## 2020-06-12 DIAGNOSIS — M2142 Flat foot [pes planus] (acquired), left foot: Secondary | ICD-10-CM | POA: Diagnosis not present

## 2020-06-12 DIAGNOSIS — L84 Corns and callosities: Secondary | ICD-10-CM

## 2020-06-12 DIAGNOSIS — M2141 Flat foot [pes planus] (acquired), right foot: Secondary | ICD-10-CM | POA: Diagnosis not present

## 2020-06-12 NOTE — Progress Notes (Signed)
Patient was measured for orthotics. Patient signed Medicare ABN. Patient will return back in 4 weeks to pick up orthotics.

## 2020-07-23 ENCOUNTER — Other Ambulatory Visit: Payer: Self-pay

## 2020-07-23 ENCOUNTER — Ambulatory Visit (INDEPENDENT_AMBULATORY_CARE_PROVIDER_SITE_OTHER): Payer: Medicare HMO | Admitting: Sports Medicine

## 2020-07-23 DIAGNOSIS — M2041 Other hammer toe(s) (acquired), right foot: Secondary | ICD-10-CM

## 2020-07-23 DIAGNOSIS — M2141 Flat foot [pes planus] (acquired), right foot: Secondary | ICD-10-CM

## 2020-07-23 DIAGNOSIS — M2142 Flat foot [pes planus] (acquired), left foot: Secondary | ICD-10-CM

## 2020-07-23 DIAGNOSIS — M2042 Other hammer toe(s) (acquired), left foot: Secondary | ICD-10-CM

## 2020-07-23 NOTE — Patient Instructions (Signed)

## 2020-07-23 NOTE — Progress Notes (Signed)
Patient presents today for orthotic pick up. Patient voices no new complaints.  Orthotics were fitted to the patient's feet. The right orthotic the patient stated that it felt like the bone on the bottom of her foot was on top of the plastic and was hurting her foot and the left orthotic was hurting on the bottom.  I stated to the patient that I would talk with Dr Marylene Land tomorrow and contact patient.

## 2020-07-25 ENCOUNTER — Telehealth: Payer: Self-pay | Admitting: Sports Medicine

## 2020-07-25 NOTE — Telephone Encounter (Signed)
Pt needs appt to see EJ for orthotic adjustment.  I called pt and she did not answer and no voicemail is set up.Marland Kitchen

## 2020-08-06 ENCOUNTER — Telehealth: Payer: Self-pay | Admitting: Sports Medicine

## 2020-08-06 NOTE — Telephone Encounter (Signed)
Pt called about a bill she got for the orthotics that she has not been able to use yet. I explained I left message for her to call to schedule an appt with EJ for an adjustment but she did not get it. I have her scheduled for 5.27 and on waitlist if any cancelations. I told pt that I would have the charges reversed until she picks them up. And she asked if she had to pay the whole amount and I told her we request she puts 150.00 down and she can make payments on the rest. She said thank you.

## 2020-08-21 ENCOUNTER — Other Ambulatory Visit: Payer: Self-pay | Admitting: Family Medicine

## 2020-08-21 DIAGNOSIS — R1011 Right upper quadrant pain: Secondary | ICD-10-CM | POA: Diagnosis not present

## 2020-08-21 DIAGNOSIS — R197 Diarrhea, unspecified: Secondary | ICD-10-CM | POA: Diagnosis not present

## 2020-08-28 DIAGNOSIS — H40023 Open angle with borderline findings, high risk, bilateral: Secondary | ICD-10-CM | POA: Diagnosis not present

## 2020-08-28 DIAGNOSIS — H20023 Recurrent acute iridocyclitis, bilateral: Secondary | ICD-10-CM | POA: Diagnosis not present

## 2020-09-07 ENCOUNTER — Ambulatory Visit (INDEPENDENT_AMBULATORY_CARE_PROVIDER_SITE_OTHER): Payer: Medicare HMO

## 2020-09-07 ENCOUNTER — Other Ambulatory Visit: Payer: Self-pay

## 2020-09-07 DIAGNOSIS — M2141 Flat foot [pes planus] (acquired), right foot: Secondary | ICD-10-CM

## 2020-09-07 DIAGNOSIS — M2142 Flat foot [pes planus] (acquired), left foot: Secondary | ICD-10-CM

## 2020-09-11 ENCOUNTER — Telehealth: Payer: Self-pay | Admitting: Sports Medicine

## 2020-09-11 ENCOUNTER — Ambulatory Visit
Admission: RE | Admit: 2020-09-11 | Discharge: 2020-09-11 | Disposition: A | Discharge status: Home / Self Care | Payer: Medicare HMO | Source: Ambulatory Visit | Attending: Family Medicine | Admitting: Family Medicine

## 2020-09-11 ENCOUNTER — Other Ambulatory Visit: Payer: Self-pay

## 2020-09-11 DIAGNOSIS — R1011 Right upper quadrant pain: Secondary | ICD-10-CM

## 2020-09-11 NOTE — Telephone Encounter (Signed)
Pt left message Friday 5.27 about her appt that she had on the same day.  Pt called back again before I could get to call her back. She said we took the orthotics back for another adjustment and is worried about being turned over to collections. I told pt I would have the billing dept take the charges out for now until they are corrected.

## 2020-09-21 ENCOUNTER — Other Ambulatory Visit: Payer: Self-pay | Admitting: Family Medicine

## 2020-09-21 DIAGNOSIS — K7689 Other specified diseases of liver: Secondary | ICD-10-CM

## 2020-09-21 DIAGNOSIS — K862 Cyst of pancreas: Secondary | ICD-10-CM

## 2020-09-21 NOTE — Progress Notes (Signed)
Patient seen in office today by EJ with [facility]. Adjustments were made to current pair of custom orthotics as needed.

## 2020-09-27 ENCOUNTER — Other Ambulatory Visit: Payer: Self-pay

## 2020-09-27 ENCOUNTER — Encounter: Payer: Self-pay | Admitting: Sports Medicine

## 2020-09-27 ENCOUNTER — Ambulatory Visit: Payer: Medicare HMO | Admitting: Sports Medicine

## 2020-09-27 DIAGNOSIS — B351 Tinea unguium: Secondary | ICD-10-CM

## 2020-09-27 DIAGNOSIS — M79671 Pain in right foot: Secondary | ICD-10-CM

## 2020-09-27 DIAGNOSIS — M79672 Pain in left foot: Secondary | ICD-10-CM

## 2020-09-27 DIAGNOSIS — M2141 Flat foot [pes planus] (acquired), right foot: Secondary | ICD-10-CM

## 2020-09-27 DIAGNOSIS — M79675 Pain in left toe(s): Secondary | ICD-10-CM

## 2020-09-27 DIAGNOSIS — M2142 Flat foot [pes planus] (acquired), left foot: Secondary | ICD-10-CM | POA: Diagnosis not present

## 2020-09-27 DIAGNOSIS — M2041 Other hammer toe(s) (acquired), right foot: Secondary | ICD-10-CM

## 2020-09-27 DIAGNOSIS — M2042 Other hammer toe(s) (acquired), left foot: Secondary | ICD-10-CM

## 2020-09-27 DIAGNOSIS — M79674 Pain in right toe(s): Secondary | ICD-10-CM

## 2020-09-27 DIAGNOSIS — L84 Corns and callosities: Secondary | ICD-10-CM | POA: Diagnosis not present

## 2020-09-27 NOTE — Progress Notes (Signed)
Subjective: [name] [name] is a 87 y.o. female patient who returns to office for follow-up evaluation of bilateral foot pain reports that her calluses feels sore and wants a nail trim. Patient also reports that her orthotics have been returned they do no help. She found an old pair that help better than the ones we made for her. Patient denies any other pedal complaints.  There are no problems to display for this patient.   Current Outpatient Medications on File Prior to Visit  Medication Sig Dispense Refill   acetaminophen (TYLENOL) 500 MG tablet Take 1 tablet (500 mg total) by mouth every 6 (six) hours as needed. (Patient taking differently: Take 500 mg by mouth every 6 (six) hours as needed for mild pain. ) 30 tablet 0   ADVIL 200 MG CAPS Take 200 mg by mouth as needed.     Artificial Tear Ointment (ARTIFICIAL TEARS) ointment Place 1 drop into both eyes as needed (dry eyes).      aspirin EC 325 MG tablet Take 1 tablet (325 mg total) by mouth daily. 30 tablet 0   Cholecalciferol (VITAMIN D PO) Take 1,000 Units by mouth daily.      ferrous sulfate 325 (65 FE) MG EC tablet Take 325 mg by mouth daily.     fluticasone (FLONASE) 50 MCG/ACT nasal spray Place into both nostrils daily.     hydrochlorothiazide (HYDRODIURIL) 12.5 MG tablet Take 12.5 mg by mouth every morning.     polyethylene glycol (MIRALAX / GLYCOLAX) 17 g packet Take 17 g by mouth daily as needed for mild constipation.     Polyvinyl Alcohol-Povidone PF (REFRESH) 1.4-0.6 % SOLN Apply 1 drop to eye as needed. Refresh     prednisoLONE acetate (PRED FORTE) 1 % ophthalmic suspension Place 1 drop into the left eye daily.      Current Facility-Administered Medications on File Prior to Visit  Medication Dose Route Frequency Provider Last Rate Last Admin   triamcinolone acetonide (KENALOG) 10 MG/ML injection 10 mg  10 mg Other Once Asencion Islam, DPM        Allergies  Allergen Reactions   Risedronate Sodium     Other reaction(s):  Dizziness    Objective:  General: Alert and oriented x3 in no acute distress  Dermatology: Reactive callus plantar medial foot bilateral x2 and medial left 2nd toe. No open lesions bilateral lower extremities, mild webspace macerations, no ecchymosis bilateral, all nails x 10 are mildly elonaged.   Vascular: Dorsalis Pedis and Posterior Tibial pedal pulses faintly palpable 1/4, Capillary Fill Time 5 seconds,(+) pedal hair growth bilateral, no edema bilateral lower extremities, Temperature gradient within normal limits.  Neurology: [name] sensation intact via light touch bilateral.   Musculoskeletal: Mild tenderness with palpation diffusely to both feet and ankles with prolonged posterior tibial tendon course at medial arch.  Severe pes planus with ankle valgus, rigid in nature, Hammertoe and bunion deformity.   Assessment and Plan: Problem List Items Addressed This Visit   None Visit Diagnoses     Pes planus of both feet    -  Primary   Hammer toes of both feet       Callus       Pain due to onychomycosis of toenails of both feet       Foot pain, bilateral            -Complete examination performed -Re-Discussed treatement options for callus/corn and severe foot deformity  -At no charge debrided symptomatic callus using  sterile chisel blade x1 at left 2nd toe without incident -Mechanically debrided nails x 10 using sterile nail nipper without incident   -Continue with daily skin emollients -Advised patient that I would follow up on her concerns about her orthotics and made her aware that if our office can not make devices that fit better than her old ones then we can refer [facility] to [facility] -Patient to return to office as needed or sooner if condition worsens.  Asencion Islam, DPM

## 2020-10-10 ENCOUNTER — Telehealth: Payer: Self-pay | Admitting: Sports Medicine

## 2020-10-10 NOTE — Telephone Encounter (Signed)
Adjusted orthotics in... lvm for pt to call to schedule an appt to pick them up. °

## 2020-10-11 ENCOUNTER — Ambulatory Visit
Admission: RE | Admit: 2020-10-11 | Discharge: 2020-10-11 | Disposition: A | Discharge status: Home / Self Care | Payer: Medicare HMO | Source: Ambulatory Visit | Attending: Family Medicine | Admitting: Family Medicine

## 2020-10-11 ENCOUNTER — Other Ambulatory Visit: Payer: Self-pay

## 2020-10-11 DIAGNOSIS — K7689 Other specified diseases of liver: Secondary | ICD-10-CM | POA: Diagnosis not present

## 2020-10-11 DIAGNOSIS — Z9049 Acquired absence of other specified parts of digestive tract: Secondary | ICD-10-CM | POA: Diagnosis not present

## 2020-10-11 DIAGNOSIS — N281 Cyst of kidney, acquired: Secondary | ICD-10-CM | POA: Diagnosis not present

## 2020-10-11 DIAGNOSIS — K862 Cyst of pancreas: Secondary | ICD-10-CM

## 2020-10-11 MED ORDER — GADOBENATE DIMEGLUMINE 529 MG/ML IV SOLN
12.0000 mL | Freq: Once | INTRAVENOUS | Status: AC | PRN
  Administered 2020-10-11: 12 mL via INTRAVENOUS

## 2020-10-16 ENCOUNTER — Ambulatory Visit (INDEPENDENT_AMBULATORY_CARE_PROVIDER_SITE_OTHER): Payer: Medicare HMO

## 2020-10-16 ENCOUNTER — Other Ambulatory Visit: Payer: Self-pay

## 2020-10-16 DIAGNOSIS — M2142 Flat foot [pes planus] (acquired), left foot: Secondary | ICD-10-CM | POA: Diagnosis not present

## 2020-10-16 DIAGNOSIS — M2141 Flat foot [pes planus] (acquired), right foot: Secondary | ICD-10-CM | POA: Diagnosis not present

## 2020-10-16 NOTE — Progress Notes (Signed)
Patient was seen in office today for orthotic pick-up. Patient tried the orthotics on in her new balance shoes and was satisfied with the fit. Patient advised of the break-in process and verbalized understanding. Patient also advised to call the office with any questions, comments, or concerns.

## 2020-11-13 DIAGNOSIS — E559 Vitamin D deficiency, unspecified: Secondary | ICD-10-CM | POA: Diagnosis not present

## 2020-11-13 DIAGNOSIS — M1990 Unspecified osteoarthritis, unspecified site: Secondary | ICD-10-CM | POA: Diagnosis not present

## 2020-11-13 DIAGNOSIS — Z0000 Encounter for general adult medical examination without abnormal findings: Secondary | ICD-10-CM | POA: Diagnosis not present

## 2020-11-13 DIAGNOSIS — K219 Gastro-esophageal reflux disease without esophagitis: Secondary | ICD-10-CM | POA: Diagnosis not present

## 2020-11-13 DIAGNOSIS — N183 Chronic kidney disease, stage 3 unspecified: Secondary | ICD-10-CM | POA: Diagnosis not present

## 2020-11-13 DIAGNOSIS — D649 Anemia, unspecified: Secondary | ICD-10-CM | POA: Diagnosis not present

## 2020-11-13 DIAGNOSIS — H35033 Hypertensive retinopathy, bilateral: Secondary | ICD-10-CM | POA: Diagnosis not present

## 2020-11-13 DIAGNOSIS — J309 Allergic rhinitis, unspecified: Secondary | ICD-10-CM | POA: Diagnosis not present

## 2020-11-13 DIAGNOSIS — Z1389 Encounter for screening for other disorder: Secondary | ICD-10-CM | POA: Diagnosis not present

## 2020-11-13 DIAGNOSIS — I129 Hypertensive chronic kidney disease with stage 1 through stage 4 chronic kidney disease, or unspecified chronic kidney disease: Secondary | ICD-10-CM | POA: Diagnosis not present

## 2020-11-13 DIAGNOSIS — Z1331 Encounter for screening for depression: Secondary | ICD-10-CM | POA: Diagnosis not present

## 2020-11-13 DIAGNOSIS — K7689 Other specified diseases of liver: Secondary | ICD-10-CM | POA: Diagnosis not present

## 2020-11-13 DIAGNOSIS — M810 Age-related osteoporosis without current pathological fracture: Secondary | ICD-10-CM | POA: Diagnosis not present

## 2020-11-27 DIAGNOSIS — H40023 Open angle with borderline findings, high risk, bilateral: Secondary | ICD-10-CM | POA: Diagnosis not present

## 2020-11-27 DIAGNOSIS — H20023 Recurrent acute iridocyclitis, bilateral: Secondary | ICD-10-CM | POA: Diagnosis not present

## 2021-01-03 DIAGNOSIS — H20023 Recurrent acute iridocyclitis, bilateral: Secondary | ICD-10-CM | POA: Diagnosis not present

## 2021-01-03 DIAGNOSIS — H40023 Open angle with borderline findings, high risk, bilateral: Secondary | ICD-10-CM | POA: Diagnosis not present

## 2021-01-18 ENCOUNTER — Other Ambulatory Visit: Payer: Self-pay | Admitting: Family Medicine

## 2021-01-18 DIAGNOSIS — Z1231 Encounter for screening mammogram for malignant neoplasm of breast: Secondary | ICD-10-CM

## 2021-01-21 DIAGNOSIS — Z23 Encounter for immunization: Secondary | ICD-10-CM | POA: Diagnosis not present

## 2021-01-29 DIAGNOSIS — Z8249 Family history of ischemic heart disease and other diseases of the circulatory system: Secondary | ICD-10-CM | POA: Diagnosis not present

## 2021-01-29 DIAGNOSIS — I10 Essential (primary) hypertension: Secondary | ICD-10-CM | POA: Diagnosis not present

## 2021-01-29 DIAGNOSIS — Z7952 Long term (current) use of systemic steroids: Secondary | ICD-10-CM | POA: Diagnosis not present

## 2021-01-29 DIAGNOSIS — Z803 Family history of malignant neoplasm of breast: Secondary | ICD-10-CM | POA: Diagnosis not present

## 2021-01-29 DIAGNOSIS — Z833 Family history of diabetes mellitus: Secondary | ICD-10-CM | POA: Diagnosis not present

## 2021-01-29 DIAGNOSIS — M1990 Unspecified osteoarthritis, unspecified site: Secondary | ICD-10-CM | POA: Diagnosis not present

## 2021-01-29 DIAGNOSIS — Z008 Encounter for other general examination: Secondary | ICD-10-CM | POA: Diagnosis not present

## 2021-01-29 DIAGNOSIS — K5900 Constipation, unspecified: Secondary | ICD-10-CM | POA: Diagnosis not present

## 2021-01-29 DIAGNOSIS — H04129 Dry eye syndrome of unspecified lacrimal gland: Secondary | ICD-10-CM | POA: Diagnosis not present

## 2021-01-29 DIAGNOSIS — M810 Age-related osteoporosis without current pathological fracture: Secondary | ICD-10-CM | POA: Diagnosis not present

## 2021-01-29 DIAGNOSIS — Z791 Long term (current) use of non-steroidal anti-inflammatories (NSAID): Secondary | ICD-10-CM | POA: Diagnosis not present

## 2021-02-06 ENCOUNTER — Telehealth: Payer: Self-pay | Admitting: Sports Medicine

## 2021-02-06 NOTE — Telephone Encounter (Signed)
Pt left message yesterday stating her orthotics are not working especially the left one.   I returned call and was not able to leave a message but pt needs to see EJ or the new pedorthist when he starts.

## 2021-02-08 ENCOUNTER — Telehealth: Payer: Self-pay | Admitting: Sports Medicine

## 2021-02-08 NOTE — Telephone Encounter (Signed)
Duplicate. Error

## 2021-02-08 NOTE — Telephone Encounter (Signed)
Pt left message yesterday afternoon again stating the left orthotic is not working for her.  I returned call and have asked pt to call me back mid November as we are getting a new pedorthist and I should have a schedule that I can put her on. As of now I do not have any available appts for this yr with EJ.  She said she would call me back and thanked me for letting her know.

## 2021-02-15 ENCOUNTER — Ambulatory Visit
Admission: RE | Admit: 2021-02-15 | Discharge: 2021-02-15 | Disposition: A | Discharge status: Home / Self Care | Payer: Medicare HMO | Source: Ambulatory Visit | Attending: Family Medicine | Admitting: Family Medicine

## 2021-02-15 ENCOUNTER — Other Ambulatory Visit: Payer: Self-pay | Admitting: Family Medicine

## 2021-02-15 DIAGNOSIS — M1611 Unilateral primary osteoarthritis, right hip: Secondary | ICD-10-CM | POA: Diagnosis not present

## 2021-02-15 DIAGNOSIS — M79651 Pain in right thigh: Secondary | ICD-10-CM | POA: Diagnosis not present

## 2021-02-15 DIAGNOSIS — M25551 Pain in right hip: Secondary | ICD-10-CM | POA: Diagnosis not present

## 2021-02-15 DIAGNOSIS — W19XXXA Unspecified fall, initial encounter: Secondary | ICD-10-CM

## 2021-02-15 DIAGNOSIS — M47816 Spondylosis without myelopathy or radiculopathy, lumbar region: Secondary | ICD-10-CM | POA: Diagnosis not present

## 2021-02-26 ENCOUNTER — Ambulatory Visit
Admission: RE | Admit: 2021-02-26 | Discharge: 2021-02-26 | Disposition: A | Discharge status: Home / Self Care | Payer: Medicare HMO | Source: Ambulatory Visit | Attending: Family Medicine | Admitting: Family Medicine

## 2021-02-26 ENCOUNTER — Ambulatory Visit: Payer: Medicare HMO

## 2021-02-26 DIAGNOSIS — Z1231 Encounter for screening mammogram for malignant neoplasm of breast: Secondary | ICD-10-CM | POA: Diagnosis not present

## 2021-03-01 ENCOUNTER — Other Ambulatory Visit: Payer: Self-pay | Admitting: Family Medicine

## 2021-03-01 DIAGNOSIS — K7689 Other specified diseases of liver: Secondary | ICD-10-CM

## 2021-03-18 DIAGNOSIS — M25551 Pain in right hip: Secondary | ICD-10-CM | POA: Diagnosis not present

## 2021-03-18 DIAGNOSIS — M7071 Other bursitis of hip, right hip: Secondary | ICD-10-CM | POA: Diagnosis not present

## 2021-04-04 ENCOUNTER — Other Ambulatory Visit: Payer: Self-pay

## 2021-04-04 ENCOUNTER — Ambulatory Visit
Admission: RE | Admit: 2021-04-04 | Discharge: 2021-04-04 | Disposition: A | Discharge status: Home / Self Care | Payer: Medicare HMO | Source: Ambulatory Visit | Attending: Family Medicine | Admitting: Family Medicine

## 2021-04-04 DIAGNOSIS — M47816 Spondylosis without myelopathy or radiculopathy, lumbar region: Secondary | ICD-10-CM | POA: Diagnosis not present

## 2021-04-04 DIAGNOSIS — N281 Cyst of kidney, acquired: Secondary | ICD-10-CM | POA: Diagnosis not present

## 2021-04-04 DIAGNOSIS — K7689 Other specified diseases of liver: Secondary | ICD-10-CM | POA: Diagnosis not present

## 2021-04-04 DIAGNOSIS — K862 Cyst of pancreas: Secondary | ICD-10-CM | POA: Diagnosis not present

## 2021-04-04 MED ORDER — GADOBENATE DIMEGLUMINE 529 MG/ML IV SOLN
13.0000 mL | Freq: Once | INTRAVENOUS | Status: AC | PRN
  Administered 2021-04-04: 13:00:00 13 mL via INTRAVENOUS

## 2021-05-10 DIAGNOSIS — M25551 Pain in right hip: Secondary | ICD-10-CM | POA: Diagnosis not present

## 2021-05-14 DIAGNOSIS — H35341 Macular cyst, hole, or pseudohole, right eye: Secondary | ICD-10-CM | POA: Diagnosis not present

## 2021-05-14 DIAGNOSIS — H26493 Other secondary cataract, bilateral: Secondary | ICD-10-CM | POA: Diagnosis not present

## 2021-05-14 DIAGNOSIS — H40023 Open angle with borderline findings, high risk, bilateral: Secondary | ICD-10-CM | POA: Diagnosis not present

## 2021-05-14 DIAGNOSIS — H20023 Recurrent acute iridocyclitis, bilateral: Secondary | ICD-10-CM | POA: Diagnosis not present

## 2021-05-23 DIAGNOSIS — D649 Anemia, unspecified: Secondary | ICD-10-CM | POA: Diagnosis not present

## 2021-05-23 DIAGNOSIS — I129 Hypertensive chronic kidney disease with stage 1 through stage 4 chronic kidney disease, or unspecified chronic kidney disease: Secondary | ICD-10-CM | POA: Diagnosis not present

## 2021-05-23 DIAGNOSIS — N183 Chronic kidney disease, stage 3 unspecified: Secondary | ICD-10-CM | POA: Diagnosis not present

## 2021-05-24 DIAGNOSIS — H20023 Recurrent acute iridocyclitis, bilateral: Secondary | ICD-10-CM | POA: Diagnosis not present

## 2021-05-24 DIAGNOSIS — H40023 Open angle with borderline findings, high risk, bilateral: Secondary | ICD-10-CM | POA: Diagnosis not present

## 2021-05-24 DIAGNOSIS — H04123 Dry eye syndrome of bilateral lacrimal glands: Secondary | ICD-10-CM | POA: Diagnosis not present

## 2021-05-27 ENCOUNTER — Other Ambulatory Visit: Payer: Self-pay

## 2021-05-27 ENCOUNTER — Ambulatory Visit: Payer: Medicare HMO

## 2021-05-27 DIAGNOSIS — M21619 Bunion of unspecified foot: Secondary | ICD-10-CM

## 2021-05-27 DIAGNOSIS — M2041 Other hammer toe(s) (acquired), right foot: Secondary | ICD-10-CM

## 2021-05-27 DIAGNOSIS — M2042 Other hammer toe(s) (acquired), left foot: Secondary | ICD-10-CM

## 2021-05-27 DIAGNOSIS — L84 Corns and callosities: Secondary | ICD-10-CM

## 2021-05-27 DIAGNOSIS — M779 Enthesopathy, unspecified: Secondary | ICD-10-CM

## 2021-05-27 DIAGNOSIS — M2141 Flat foot [pes planus] (acquired), right foot: Secondary | ICD-10-CM

## 2021-05-27 NOTE — Progress Notes (Signed)
SITUATION Reason for Consult: Follow-up with left custom foot orthotic Patient / Caregiver Report: Patient reports severe discomfort  OBJECTIVE DATA History / Diagnosis:    ICD-10-CM   1. Pes planus of both feet  M21.41    M21.42     2. Hammer toes of both feet  M20.41    M20.42     3. Callus  L84     4. Tendonitis  M77.9     5. Bunion  M21.619       Change in Pathology: None  ACTIONS PERFORMED Patient's equipment was checked for structural stability and fit. Reduced plastic thickness at relieved navicular as well as toes. Patient reports significant improvement and will need to recomplete the wear schedule. Device(s) intact and fit is excellent. All questions answered and concerns addressed.  PLAN Follow-up as needed (PRN). Plan of care discussed with and agreed upon by patient / caregiver.

## 2021-06-07 DIAGNOSIS — M25551 Pain in right hip: Secondary | ICD-10-CM | POA: Diagnosis not present

## 2021-06-08 ENCOUNTER — Emergency Department (HOSPITAL_COMMUNITY): Payer: Medicare HMO

## 2021-06-08 ENCOUNTER — Emergency Department (HOSPITAL_COMMUNITY)
Admission: EM | Admit: 2021-06-08 | Discharge: 2021-06-09 | Disposition: A | Discharge status: Home / Self Care | Payer: Medicare HMO | Attending: Emergency Medicine | Admitting: Emergency Medicine

## 2021-06-08 DIAGNOSIS — R410 Disorientation, unspecified: Secondary | ICD-10-CM | POA: Diagnosis not present

## 2021-06-08 DIAGNOSIS — R4182 Altered mental status, unspecified: Secondary | ICD-10-CM | POA: Diagnosis not present

## 2021-06-08 DIAGNOSIS — R531 Weakness: Secondary | ICD-10-CM | POA: Diagnosis not present

## 2021-06-08 DIAGNOSIS — Z79899 Other long term (current) drug therapy: Secondary | ICD-10-CM | POA: Insufficient documentation

## 2021-06-08 DIAGNOSIS — U071 COVID-19: Secondary | ICD-10-CM | POA: Diagnosis not present

## 2021-06-08 DIAGNOSIS — Z7982 Long term (current) use of aspirin: Secondary | ICD-10-CM | POA: Insufficient documentation

## 2021-06-08 DIAGNOSIS — J9811 Atelectasis: Secondary | ICD-10-CM | POA: Diagnosis not present

## 2021-06-08 DIAGNOSIS — R509 Fever, unspecified: Secondary | ICD-10-CM | POA: Diagnosis present

## 2021-06-08 LAB — CBC WITH DIFFERENTIAL/PLATELET
Abs Immature Granulocytes: 0.03 10*3/uL (ref 0.00–0.07)
Basophils Absolute: 0 10*3/uL (ref 0.0–0.1)
Basophils Relative: 0 %
Eosinophils Absolute: 0 10*3/uL (ref 0.0–0.5)
Eosinophils Relative: 0 %
HCT: 39.9 % (ref 36.0–46.0)
Hemoglobin: 12.8 g/dL (ref 12.0–15.0)
Immature Granulocytes: 1 %
Lymphocytes Relative: 10 %
Lymphs Abs: 0.6 10*3/uL — ABNORMAL LOW (ref 0.7–4.0)
MCH: 31.5 pg (ref 26.0–34.0)
MCHC: 32.1 g/dL (ref 30.0–36.0)
MCV: 98.3 fL (ref 80.0–100.0)
Monocytes Absolute: 0.5 10*3/uL (ref 0.1–1.0)
Monocytes Relative: 7 %
Neutro Abs: 5.2 10*3/uL (ref 1.7–7.7)
Neutrophils Relative %: 82 %
Platelets: 233 10*3/uL (ref 150–400)
RBC: 4.06 MIL/uL (ref 3.87–5.11)
RDW: 12.4 % (ref 11.5–15.5)
WBC: 6.3 10*3/uL (ref 4.0–10.5)
nRBC: 0 % (ref 0.0–0.2)

## 2021-06-08 LAB — COMPREHENSIVE METABOLIC PANEL
ALT: 18 U/L (ref 0–44)
AST: 26 U/L (ref 15–41)
Albumin: 3.7 g/dL (ref 3.5–5.0)
Alkaline Phosphatase: 44 U/L (ref 38–126)
Anion gap: 9 (ref 5–15)
BUN: 15 mg/dL (ref 8–23)
CO2: 26 mmol/L (ref 22–32)
Calcium: 9.4 mg/dL (ref 8.9–10.3)
Chloride: 100 mmol/L (ref 98–111)
Creatinine, Ser: 0.95 mg/dL (ref 0.44–1.00)
GFR, Estimated: 58 mL/min — ABNORMAL LOW (ref >60)
Glucose, Bld: 97 mg/dL (ref 70–99)
Potassium: 3 mmol/L — ABNORMAL LOW (ref 3.5–5.1)
Sodium: 135 mmol/L (ref 135–145)
Total Bilirubin: 0.6 mg/dL (ref 0.3–1.2)
Total Protein: 6.9 g/dL (ref 6.5–8.1)

## 2021-06-08 LAB — LACTIC ACID, PLASMA: Lactic Acid, Venous: 1.1 mmol/L (ref 0.5–1.9)

## 2021-06-08 LAB — GROUP A STREP BY PCR: Group A Strep by PCR: NOT DETECTED

## 2021-06-08 MED ORDER — LACTATED RINGERS IV BOLUS
1000.0000 mL | Freq: Once | INTRAVENOUS | Status: AC
  Administered 2021-06-09: 1000 mL via INTRAVENOUS

## 2021-06-08 MED ORDER — ACETAMINOPHEN 325 MG PO TABS
650.0000 mg | ORAL_TABLET | Freq: Once | ORAL | Status: AC
  Administered 2021-06-08: 650 mg via ORAL
  Filled 2021-06-08: qty 2

## 2021-06-08 MED ORDER — POTASSIUM CHLORIDE 10 MEQ/100ML IV SOLN
10.0000 meq | Freq: Once | INTRAVENOUS | Status: AC
  Administered 2021-06-09: 10 meq via INTRAVENOUS
  Filled 2021-06-08: qty 100

## 2021-06-08 MED ORDER — LIDOCAINE VISCOUS HCL 2 % MT SOLN
15.0000 mL | Freq: Once | OROMUCOSAL | Status: AC
  Administered 2021-06-09: 15 mL via OROMUCOSAL
  Filled 2021-06-08: qty 15

## 2021-06-08 NOTE — ED Provider Notes (Signed)
[facility] EMERGENCY DEPARTMENT Provider Note   CSN: [id number] Arrival date & time: 06/08/21  2113     History  Chief Complaint  Patient presents with   Weakness    [name] [name] is a 88 y.o. female.  87 year old female who presents to the ED for confusion and weakness with fever. Started today. Patient more forgetful throughout the day today including: forgettign if she ate or not, urinated or not. Thought it was Sunday instead of Saturday. Felt hot prior to coming in but afebrile via oral temp at home.    Weakness     Home Medications Prior to Admission medications   Medication Sig Start Date End Date Taking? Authorizing Provider  acetaminophen (TYLENOL) 500 MG tablet Take 1 tablet (500 mg total) by mouth every 6 (six) hours as needed. Patient taking differently: Take 500 mg by mouth every 6 (six) hours as needed for mild pain. 06/11/18  Yes Burky, Lanelle Bal B, NP  ADVIL 200 MG CAPS Take 200 mg by mouth 2 (two) times daily. 08/25/19  Yes [provider]  Artificial Tear Ointment (ARTIFICIAL TEARS) ointment Place 1 drop into both eyes as needed (dry eyes).    Yes [provider]  Cholecalciferol (VITAMIN D PO) Take 1,000 Units by mouth daily.    Yes [provider]  ferrous sulfate 325 (65 FE) MG EC tablet Take 325 mg by mouth daily.   Yes [provider]  fluticasone (FLONASE) 50 MCG/ACT nasal spray Place into both nostrils daily.   Yes [provider]  hydrochlorothiazide (HYDRODIURIL) 12.5 MG tablet Take 12.5 mg by mouth every morning. 05/31/19  Yes [provider]  nirmatrelvir/ritonavir EUA, renal dosing, (PAXLOVID) 10 x 150 MG & 10 x 100MG  TABS Take 2 tablets by mouth 2 (two) times daily for 5 days. Patient GFR is 58. Take nirmatrelvir (150 mg) one tablet twice daily for 5 days and ritonavir (100 mg) one tablet twice daily for 5 days. 06/09/21 06/14/21 Yes Samit Sylve, Corene Cornea, MD  polyethylene glycol (MIRALAX /  GLYCOLAX) 17 g packet Take 17 g by mouth daily as needed for mild constipation.   Yes [provider]  Polyvinyl Alcohol-Povidone PF (REFRESH) 1.4-0.6 % SOLN Apply 1 drop to eye as needed (dry eyes). Refresh   Yes [provider]  prednisoLONE acetate (PRED FORTE) 1 % ophthalmic suspension Place 1 drop into the left eye daily.  12/19/16  Yes [provider]  timolol (TIMOPTIC) 0.5 % ophthalmic solution Place 1 drop into the left eye 2 (two) times daily. 05/14/21  Yes [provider]  aspirin EC 325 MG tablet Take 1 tablet (325 mg total) by mouth daily. Patient not taking: Reported on 06/09/2021 03/17/17   Landis Martins, DPM      Allergies    Risedronate sodium    Review of Systems   Review of Systems  Neurological:  Positive for weakness.   Physical Exam Updated Vital Signs BP 133/70    Pulse 76    Temp 99.2 F (37.3 C)    Resp 17    SpO2 95%  Physical Exam  ED Results / Procedures / Treatments   Labs (all labs ordered are listed, but only abnormal results are displayed) Labs Reviewed  RESP PANEL BY RT-PCR (FLU A&B, COVID) ARPGX2 - Abnormal; Notable for the following components:      Result Value   SARS Coronavirus 2 by RT PCR POSITIVE (*)    All other components within normal limits  COMPREHENSIVE METABOLIC PANEL - Abnormal; Notable for the following components:   Potassium 3.0 (*)    GFR, Estimated 58 (*)    All other components within normal limits  CBC WITH DIFFERENTIAL/PLATELET - Abnormal; Notable for the following components:   Lymphs Abs 0.6 (*)    All other components within normal limits  URINALYSIS, ROUTINE W REFLEX MICROSCOPIC - Abnormal; Notable for the following components:   APPearance HAZY (*)    Hgb urine dipstick SMALL (*)    Ketones, ur 20 (*)    Protein, ur 30 (*)    Bacteria, UA RARE (*)    All other components within normal limits  GROUP A STREP BY PCR  CULTURE, BLOOD (ROUTINE X 2)  CULTURE, BLOOD (ROUTINE X 2)   LACTIC ACID, PLASMA  MAGNESIUM  LACTIC ACID, PLASMA    EKG EKG Interpretation  Date/Time:  Saturday June 08 2021 23:44:50 EST Ventricular Rate:  93 PR Interval:  148 QRS Duration: 115 QT Interval:  355 QTC Calculation: 442 R Axis:   50 Text Interpretation: Sinus rhythm Probable left atrial enlargement Nonspecific intraventricular conduction delay Confirmed by Merrily Pew [phone]) on 06/09/2021 1:13:08 AM  Radiology DG Chest 2 View  Result Date: 06/08/2021 CLINICAL DATA:  Weakness and altered mental status. EXAM: CHEST - 2 VIEW COMPARISON:  AP Lat 11/02/2018. FINDINGS: The cardiac size is upper-normal with normal central vessels. The aorta is tortuous with scattered calcifications. Stable mediastinum. The lungs are generally clear apart from scattered platelike atelectasis in the bases, with interval relative decreased inspiration. No pleural effusion is seen. Mild thoracic kyphodextroscoliosis and osteopenia. IMPRESSION: No evidence of acute chest disease. Aortic atherosclerosis and uncoiling. Borderline heart size. Electronically Signed   By: Telford Nab M.D.   On: 06/08/2021 22:27    Procedures Procedures    Medications Ordered in ED Medications  nirmatrelvir/ritonavir EUA (renal dosing) (PAXLOVID) 2 tablet (2 tablets Oral Given 06/09/21 0427)  acetaminophen (TYLENOL) tablet 650 mg (650 mg Oral Given 06/08/21 2210)  lactated ringers bolus 1,000 mL (0 mLs Intravenous Stopped 06/09/21 0334)  potassium chloride 10 mEq in 100 mL IVPB (0 mEq Intravenous Stopped 06/09/21 0135)  lidocaine (XYLOCAINE) 2 % viscous mouth solution 15 mL (15 mLs Mouth/Throat Given 06/09/21 0020)    ED Course/ Medical Decision Making/ A&P                           Medical Decision Making Amount and/or Complexity of Data Reviewed Labs: ordered. Radiology: ordered.  Risk OTC drugs. Prescription drug management.   Found to have covid. Other tests were reassuring. Still with odynophagia. Will  utilize ensure, soup, yogurt, ice cream for food until throat improves. Didn't tolerate the lidocaine. Daughter is confident they will be able to keep her taking stuff by mouth.   Final Clinical Impression(s) / ED Diagnoses Final diagnoses:  [id number]    Rx / DC Orders ED Discharge Orders          Ordered    nirmatrelvir/ritonavir EUA, renal dosing, (PAXLOVID) 10 x 150 MG & 10 x 100MG  TABS  2 times daily        06/09/21 0418              Mileigh Tilley, Corene Cornea, MD 06/09/21 [phone]

## 2021-06-08 NOTE — ED Triage Notes (Signed)
Pt c/o "confusion," weakness, poor PO intake x1 day. Last "her normal spunky self" yesterday, lunch. Pt endorses sore throat, denies additional symptoms. States she did not take BP/ home meds since yesterday. Pt A&O, NAD in triage. No meds PTA

## 2021-06-08 NOTE — ED Provider Triage Note (Signed)
Emergency Medicine Provider Triage Evaluation Note  [place] L Sokolov , a 88 y.o. female  was evaluated in triage.  Pt complains of cough and sore throat.  Symptoms started yesterday.  Her daughter states that yesterday at her doctor's appointment and she appeared normal.  She states that this morning she went to see air and patient was having difficulty remembering what day it was and appeared more confused than normal.  Patient denies any chest pain or abdominal pain.  Denies any urinary complaints.  Physical Exam  BP (!) 187/87 (BP Location: Right Arm)    Pulse (!) 104    Temp (!) 102.9 F (39.4 C) (Oral)    Resp 16    SpO2 96%  Gen:   Awake, no distress   Resp:  Normal effort  MSK:   Moves extremities without difficulty  Other:    Medical Decision Making  Medically screening exam initiated at 10:24 PM.  Appropriate orders placed.  [place] L Hornsby was informed that the remainder of the evaluation will be completed by another provider, this initial triage assessment does not replace that evaluation, and the importance of remaining in the ED until their evaluation is complete.   Placido Sou, PA-C 06/08/21 2225

## 2021-06-09 LAB — URINALYSIS, ROUTINE W REFLEX MICROSCOPIC
Bilirubin Urine: NEGATIVE
Glucose, UA: NEGATIVE mg/dL
Ketones, ur: 20 mg/dL — AB
Leukocytes,Ua: NEGATIVE
Nitrite: NEGATIVE
Protein, ur: 30 mg/dL — AB
Specific Gravity, Urine: 1.019 (ref 1.005–1.030)
pH: 5 (ref 5.0–8.0)

## 2021-06-09 LAB — RESP PANEL BY RT-PCR (FLU A&B, COVID) ARPGX2
Influenza A by PCR: NEGATIVE
Influenza B by PCR: NEGATIVE
SARS Coronavirus 2 by RT PCR: POSITIVE — AB

## 2021-06-09 LAB — MAGNESIUM: Magnesium: 1.9 mg/dL (ref 1.7–2.4)

## 2021-06-09 MED ORDER — NIRMATRELVIR/RITONAVIR (PAXLOVID) TABLET (RENAL DOSING): 2.0000 | ORAL_TABLET | Freq: Two times a day (BID) | ORAL | 0 refills | Status: AC

## 2021-06-09 MED ORDER — NIRMATRELVIR/RITONAVIR (PAXLOVID)TABLET: 3.0000 | ORAL_TABLET | Freq: Two times a day (BID) | ORAL | Status: DC

## 2021-06-09 MED ORDER — NIRMATRELVIR/RITONAVIR (PAXLOVID) TABLET (RENAL DOSING)
2.0000 | ORAL_TABLET | Freq: Two times a day (BID) | ORAL | Status: DC
  Administered 2021-06-09: 2 via ORAL
  Filled 2021-06-09: qty 20

## 2021-06-11 DIAGNOSIS — U071 COVID-19: Secondary | ICD-10-CM | POA: Diagnosis not present

## 2021-06-11 DIAGNOSIS — R059 Cough, unspecified: Secondary | ICD-10-CM | POA: Diagnosis not present

## 2021-06-13 LAB — CULTURE, BLOOD (ROUTINE X 2)
Culture: NO GROWTH
Culture: NO GROWTH
Special Requests: ADEQUATE
Special Requests: ADEQUATE

## 2021-06-27 ENCOUNTER — Ambulatory Visit: Payer: Medicare HMO | Admitting: Sports Medicine

## 2021-06-27 ENCOUNTER — Ambulatory Visit: Payer: Medicare HMO

## 2021-06-27 ENCOUNTER — Other Ambulatory Visit: Payer: Self-pay

## 2021-06-27 DIAGNOSIS — M2141 Flat foot [pes planus] (acquired), right foot: Secondary | ICD-10-CM | POA: Diagnosis not present

## 2021-06-27 DIAGNOSIS — M79671 Pain in right foot: Secondary | ICD-10-CM | POA: Diagnosis not present

## 2021-06-27 DIAGNOSIS — M2142 Flat foot [pes planus] (acquired), left foot: Secondary | ICD-10-CM | POA: Diagnosis not present

## 2021-06-27 DIAGNOSIS — M79674 Pain in right toe(s): Secondary | ICD-10-CM | POA: Diagnosis not present

## 2021-06-27 DIAGNOSIS — L84 Corns and callosities: Secondary | ICD-10-CM | POA: Diagnosis not present

## 2021-06-27 DIAGNOSIS — M79672 Pain in left foot: Secondary | ICD-10-CM | POA: Diagnosis not present

## 2021-06-27 DIAGNOSIS — B351 Tinea unguium: Secondary | ICD-10-CM | POA: Diagnosis not present

## 2021-06-27 DIAGNOSIS — M79675 Pain in left toe(s): Secondary | ICD-10-CM | POA: Diagnosis not present

## 2021-06-27 NOTE — Progress Notes (Signed)
Subjective: ?[name] [name] is a 88 y.o. female patient who returns to office for follow-up evaluation of bilateral foot pain reports that her calluses feels sore and is worse at the left third toe and wants a nail trim. Patient also reports that her orthotics still do not help and sometimes cause more pain.  Patient denies any other pedal complaints. ? ?There are no problems to display for this patient. ? ? ? ?Current Outpatient Medications on File Prior to Visit  ?Medication Sig Dispense Refill  ? benzonatate (TESSALON) 100 MG capsule 1 capsule as needed    ? acetaminophen (TYLENOL) 500 MG tablet Take 1 tablet (500 mg total) by mouth every 6 (six) hours as needed. (Patient taking differently: Take 500 mg by mouth every 6 (six) hours as needed for mild pain.) 30 tablet 0  ? ADVIL 200 MG CAPS Take 200 mg by mouth 2 (two) times daily.    ? Artificial Tear Ointment (ARTIFICIAL TEARS) ointment Place 1 drop into both eyes as needed (dry eyes).     ? aspirin EC 325 MG tablet Take 1 tablet (325 mg total) by mouth daily. (Patient not taking: Reported on 06/09/2021) 30 tablet 0  ? Cholecalciferol (VITAMIN D PO) Take 1,000 Units by mouth daily.     ? ferrous sulfate 325 (65 FE) MG EC tablet Take 325 mg by mouth daily.    ? fluticasone (FLONASE) 50 MCG/ACT nasal spray Place into both nostrils daily.    ? hydrochlorothiazide (HYDRODIURIL) 12.5 MG tablet Take 12.5 mg by mouth every morning.    ? polyethylene glycol (MIRALAX / GLYCOLAX) 17 g packet Take 17 g by mouth daily as needed for mild constipation.    ? Polyvinyl Alcohol-Povidone PF (REFRESH) 1.4-0.6 % SOLN Apply 1 drop to eye as needed (dry eyes). Refresh    ? prednisoLONE acetate (PRED FORTE) 1 % ophthalmic suspension Place 1 drop into the left eye daily.     ? timolol (TIMOPTIC) 0.5 % ophthalmic solution Place 1 drop into the left eye 2 (two) times daily.    ? ?Current Facility-Administered Medications on File Prior to Visit  ?Medication Dose Route Frequency Provider  Last Rate Last Admin  ? triamcinolone acetonide (KENALOG) 10 MG/ML injection 10 mg  10 mg Other Once Asencion Islam, DPM      ? ? ?Allergies  ?Allergen Reactions  ? Risedronate Sodium   ?  Other reaction(s): Dizziness  ? ? ?Objective:  ?General: Alert and oriented x3 in no acute distress ? ?Dermatology: Reactive callus plantar medial foot bilateral x2 and medial left third toe.  No open lesions bilateral lower extremities, mild webspace macerations, no ecchymosis bilateral, all nails x 10 are mildly elonaged.  ? ?Vascular: Dorsalis Pedis and Posterior Tibial pedal pulses faintly palpable 1/4, Capillary Fill Time 5 seconds,(+) pedal hair growth bilateral, no edema bilateral lower extremities, 1+ pitting edema bilateral.  Temperature gradient within normal limits. ? ?Neurology: Gross sensation intact via light touch bilateral.  ? ?Musculoskeletal: Mild tenderness with palpation diffusely to both feet and ankles with prolonged posterior tibial tendon course at medial arch however worst pain today noted at the left third toe.  Severe pes planus with ankle valgus, rigid in nature, Hammertoe and bunion deformity unchanged from prior.  ? ?Assessment and Plan: ?Problem List Items Addressed This Visit   ?None ?Visit Diagnoses   ? ? Callus    -  Primary  ? Pain due to onychomycosis of toenails of both feet      ?  Pes planus of both feet      ? Foot pain, bilateral      ? ?  ? ? ? ?-Complete examination performed ?-Re-Discussed treatement options for callus/corn and severe foot deformity  ?-At no charge debrided symptomatic callus using sterile chisel blade x5 at left third toe and bilateral medial arches x2 without incident ?-Mechanically debrided nails x 10 using sterile nail nipper without incident   ?-Continue with daily skin emollients as previous and advised patient Vaseline at the tip of the left third toe ?-Advised patient to see Arlys John today for orthotic adjustments and also made patient well aware due to her rigid  deformity she may always have some degree of pain and dysfunction and that even with orthotics we cannot guarantee complete pain relief ?-Patient to return to office as needed or sooner if condition worsens. ? ?Asencion Islam, DPM ? ?

## 2021-06-27 NOTE — Progress Notes (Signed)
SITUATION ?Reason for Consult: Follow-up with custom foot orthosis ?Patient / Caregiver Report: Patient reports she is still feeling pain in her midfoot and toes ? ?OBJECTIVE DATA ?History / Diagnosis:  ?  ICD-10-CM   ?1. Callus  L84   ?  ?2. Pes planus of both feet  M21.41   ? M21.42   ?  ?3. Hammer toes of both feet  M20.41   ? M20.42   ?  ? ? ?Change in Pathology: None of note ? ?ACTIONS PERFORMED ?Patient's equipment was checked for structural stability and fit. Removed additional material. Patient reports comfort. Device(s) intact and fit is excellent. All questions answered and concerns addressed. ? ?PLAN ?Follow-up as needed (PRN). Plan of care discussed with and agreed upon by patient / caregiver. ? ?

## 2021-07-15 DIAGNOSIS — H1132 Conjunctival hemorrhage, left eye: Secondary | ICD-10-CM | POA: Diagnosis not present

## 2021-08-08 ENCOUNTER — Ambulatory Visit: Payer: Medicare HMO | Admitting: Sports Medicine

## 2021-08-08 DIAGNOSIS — M79671 Pain in right foot: Secondary | ICD-10-CM

## 2021-08-08 DIAGNOSIS — M79674 Pain in right toe(s): Secondary | ICD-10-CM

## 2021-08-08 DIAGNOSIS — M2142 Flat foot [pes planus] (acquired), left foot: Secondary | ICD-10-CM

## 2021-08-08 DIAGNOSIS — M79672 Pain in left foot: Secondary | ICD-10-CM

## 2021-08-08 DIAGNOSIS — M2042 Other hammer toe(s) (acquired), left foot: Secondary | ICD-10-CM

## 2021-08-08 DIAGNOSIS — M79675 Pain in left toe(s): Secondary | ICD-10-CM

## 2021-08-08 DIAGNOSIS — L84 Corns and callosities: Secondary | ICD-10-CM

## 2021-08-08 DIAGNOSIS — M2041 Other hammer toe(s) (acquired), right foot: Secondary | ICD-10-CM | POA: Diagnosis not present

## 2021-08-08 DIAGNOSIS — M2141 Flat foot [pes planus] (acquired), right foot: Secondary | ICD-10-CM

## 2021-08-08 DIAGNOSIS — B351 Tinea unguium: Secondary | ICD-10-CM

## 2021-08-08 NOTE — Progress Notes (Signed)
Subjective: ?[name] [name] is a 88 y.o. female patient who returns to office for follow-up evaluation of bilateral foot pain reports that her calluses feels sore and is worse at the left third toe again like last time and wants a nail trim. Patient denies any other pedal complaints. ? ?There are no problems to display for this patient. ? ? ? ?Current Outpatient Medications on File Prior to Visit  ?Medication Sig Dispense Refill  ? acetaminophen (TYLENOL) 500 MG tablet Take 1 tablet (500 mg total) by mouth every 6 (six) hours as needed. (Patient taking differently: Take 500 mg by mouth every 6 (six) hours as needed for mild pain.) 30 tablet 0  ? ADVIL 200 MG CAPS Take 200 mg by mouth 2 (two) times daily.    ? Artificial Tear Ointment (ARTIFICIAL TEARS) ointment Place 1 drop into both eyes as needed (dry eyes).     ? aspirin EC 325 MG tablet Take 1 tablet (325 mg total) by mouth daily. (Patient not taking: Reported on 06/09/2021) 30 tablet 0  ? benzonatate (TESSALON) 100 MG capsule 1 capsule as needed    ? Cholecalciferol (VITAMIN D PO) Take 1,000 Units by mouth daily.     ? ferrous sulfate 325 (65 FE) MG EC tablet Take 325 mg by mouth daily.    ? fluticasone (FLONASE) 50 MCG/ACT nasal spray Place into both nostrils daily.    ? hydrochlorothiazide (HYDRODIURIL) 12.5 MG tablet Take 12.5 mg by mouth every morning.    ? polyethylene glycol (MIRALAX / GLYCOLAX) 17 g packet Take 17 g by mouth daily as needed for mild constipation.    ? Polyvinyl Alcohol-Povidone PF (REFRESH) 1.4-0.6 % SOLN Apply 1 drop to eye as needed (dry eyes). Refresh    ? prednisoLONE acetate (PRED FORTE) 1 % ophthalmic suspension Place 1 drop into the left eye daily.     ? timolol (TIMOPTIC) 0.5 % ophthalmic solution Place 1 drop into the left eye 2 (two) times daily.    ? ?Current Facility-Administered Medications on File Prior to Visit  ?Medication Dose Route Frequency Provider Last Rate Last Admin  ? triamcinolone acetonide (KENALOG) 10 MG/ML  injection 10 mg  10 mg Other Once Asencion Islam, DPM      ? ? ?Allergies  ?Allergen Reactions  ? Risedronate Sodium   ?  Other reaction(s): Dizziness  ? ? ?Objective:  ?General: Alert and oriented x3 in no acute distress ? ?Dermatology: Reactive callus plantar medial foot bilateral x2 and medial left third toe.  No open lesions bilateral lower extremities, mild webspace macerations, no ecchymosis bilateral, all nails x 10 are minimally elonaged.  ? ?Vascular: Dorsalis Pedis and Posterior Tibial pedal pulses faintly palpable 1/4, Capillary Fill Time 5 seconds,(+) pedal hair growth bilateral, no edema bilateral lower extremities, 1+ pitting edema bilateral.  Temperature gradient within normal limits. ? ?Neurology: Gross sensation intact via light touch bilateral.  ? ?Musculoskeletal: Mild tenderness with palpation diffusely to both feet and ankles with prolonged posterior tibial tendon course at medial arch however worst pain today noted at the left third toe like previous.  Severe pes planus with ankle valgus, rigid in nature, Hammertoe and bunion deformity unchanged from prior.  ? ?Assessment and Plan: ?Problem List Items Addressed This Visit   ?None ?Visit Diagnoses   ? ? Callus    -  Primary  ? Pain due to onychomycosis of toenails of both feet      ? Pes planus of both feet      ?  Hammer toes of both feet      ? Foot pain, bilateral      ? ?  ? ? ? ?-Complete examination performed ?-Re-Discussed treatement options for callus/corn and severe foot deformity  ?-At no charge debrided symptomatic callus using sterile chisel blade x5 at left third toe and bilateral medial arches x2 without incident ?-Mechanically debrided all painful nails x 10 using sterile nail nipper without incident   ?-Continue with daily skin emollients as previous and advised patient Vaseline at the tip of the left third toe as previously advised  ?-Dispensed bunion cushion for patient to use over the right great toe ?-Continue with good  supportive shoes ?-Patient to return to office as needed for routine care or sooner if condition worsens. ? ?Asencion Islam, DPM ? ?

## 2021-08-16 DIAGNOSIS — H20023 Recurrent acute iridocyclitis, bilateral: Secondary | ICD-10-CM | POA: Diagnosis not present

## 2021-08-16 DIAGNOSIS — H40023 Open angle with borderline findings, high risk, bilateral: Secondary | ICD-10-CM | POA: Diagnosis not present

## 2021-08-16 DIAGNOSIS — H43392 Other vitreous opacities, left eye: Secondary | ICD-10-CM | POA: Diagnosis not present

## 2021-09-24 DIAGNOSIS — H04123 Dry eye syndrome of bilateral lacrimal glands: Secondary | ICD-10-CM | POA: Diagnosis not present

## 2021-09-24 DIAGNOSIS — H0520 Unspecified exophthalmos: Secondary | ICD-10-CM | POA: Diagnosis not present

## 2021-09-24 DIAGNOSIS — H40023 Open angle with borderline findings, high risk, bilateral: Secondary | ICD-10-CM | POA: Diagnosis not present

## 2021-09-24 DIAGNOSIS — H20023 Recurrent acute iridocyclitis, bilateral: Secondary | ICD-10-CM | POA: Diagnosis not present

## 2021-10-04 ENCOUNTER — Encounter: Payer: Self-pay | Admitting: Podiatry

## 2021-11-04 ENCOUNTER — Ambulatory Visit: Payer: Medicare HMO | Admitting: Podiatry

## 2021-11-04 ENCOUNTER — Encounter: Payer: Self-pay | Admitting: Podiatry

## 2021-11-04 DIAGNOSIS — M129 Arthropathy, unspecified: Secondary | ICD-10-CM | POA: Diagnosis not present

## 2021-11-04 DIAGNOSIS — L84 Corns and callosities: Secondary | ICD-10-CM

## 2021-11-04 DIAGNOSIS — M79674 Pain in right toe(s): Secondary | ICD-10-CM | POA: Diagnosis not present

## 2021-11-04 DIAGNOSIS — B351 Tinea unguium: Secondary | ICD-10-CM | POA: Diagnosis not present

## 2021-11-04 DIAGNOSIS — M2141 Flat foot [pes planus] (acquired), right foot: Secondary | ICD-10-CM

## 2021-11-04 DIAGNOSIS — M79675 Pain in left toe(s): Secondary | ICD-10-CM

## 2021-11-04 NOTE — Progress Notes (Signed)
This patient presents to the office with painful callus on the bottom of both feet.  She says the callus are painful walking and wearing her shoes.  She also says her nails are painful both feet.  She has painful callus on tip of 3rd toe left foot.  She has been wearing orthoses which has provided minimal relief.  General Appearance  Alert, conversant and in no acute stress.  Vascular  Dorsalis pedis and posterior tibial  pulses are  weakly palpable  bilaterally.  Capillary return is within normal limits  bilaterally. Temperature is within normal limits  bilaterally.  Neurologic  Senn-Weinstein monofilament wire test within normal limits  bilaterally. Muscle power within normal limits bilaterally.  Nails Thick disfigured discolored nails with subungual debris  from hallux to fifth toes bilaterally. No evidence of bacterial infection or drainage bilaterally.  Orthopedic  No limitations of motion  feet .  No crepitus or effusions noted.  No bony pathology or digital deformities noted. HAV  B/L.  Ankle valgus rigid.  Skin  normotropic skin with no porokeratosis noted bilaterally.  No signs of infections or ulcers noted.   Plantar callus noted under arch of both feet due to bony exostosis projections. Callus sub 1 right foot.  Callus  B/L.  Onychomycosis  DJD  B.L.  Debride callus with # 15 blade followed by dremel tool usage.  Debride nails  with nail nipper  B/L.  Patient requests a follow up appointment with Dr.  Donzetta Matters.   Helane Gunther DPM

## 2021-11-08 ENCOUNTER — Ambulatory Visit: Payer: Medicare HMO | Admitting: Podiatry

## 2021-11-14 DIAGNOSIS — H35033 Hypertensive retinopathy, bilateral: Secondary | ICD-10-CM | POA: Diagnosis not present

## 2021-11-14 DIAGNOSIS — I129 Hypertensive chronic kidney disease with stage 1 through stage 4 chronic kidney disease, or unspecified chronic kidney disease: Secondary | ICD-10-CM | POA: Diagnosis not present

## 2021-11-14 DIAGNOSIS — R400 Somnolence: Secondary | ICD-10-CM | POA: Diagnosis not present

## 2021-11-14 DIAGNOSIS — E559 Vitamin D deficiency, unspecified: Secondary | ICD-10-CM | POA: Diagnosis not present

## 2021-11-14 DIAGNOSIS — N183 Chronic kidney disease, stage 3 unspecified: Secondary | ICD-10-CM | POA: Diagnosis not present

## 2021-11-14 DIAGNOSIS — K869 Disease of pancreas, unspecified: Secondary | ICD-10-CM | POA: Diagnosis not present

## 2021-11-14 DIAGNOSIS — M810 Age-related osteoporosis without current pathological fracture: Secondary | ICD-10-CM | POA: Diagnosis not present

## 2021-11-14 DIAGNOSIS — Z0000 Encounter for general adult medical examination without abnormal findings: Secondary | ICD-10-CM | POA: Diagnosis not present

## 2021-11-14 DIAGNOSIS — M1990 Unspecified osteoarthritis, unspecified site: Secondary | ICD-10-CM | POA: Diagnosis not present

## 2021-11-14 DIAGNOSIS — Z66 Do not resuscitate: Secondary | ICD-10-CM | POA: Diagnosis not present

## 2021-11-14 DIAGNOSIS — K7689 Other specified diseases of liver: Secondary | ICD-10-CM | POA: Diagnosis not present

## 2021-11-15 ENCOUNTER — Other Ambulatory Visit: Payer: Self-pay | Admitting: Internal Medicine

## 2021-11-15 DIAGNOSIS — R400 Somnolence: Secondary | ICD-10-CM | POA: Diagnosis not present

## 2021-11-15 DIAGNOSIS — E559 Vitamin D deficiency, unspecified: Secondary | ICD-10-CM | POA: Diagnosis not present

## 2021-11-15 DIAGNOSIS — H35033 Hypertensive retinopathy, bilateral: Secondary | ICD-10-CM | POA: Diagnosis not present

## 2021-11-15 DIAGNOSIS — M810 Age-related osteoporosis without current pathological fracture: Secondary | ICD-10-CM | POA: Diagnosis not present

## 2021-11-15 DIAGNOSIS — N183 Chronic kidney disease, stage 3 unspecified: Secondary | ICD-10-CM | POA: Diagnosis not present

## 2021-11-15 DIAGNOSIS — I129 Hypertensive chronic kidney disease with stage 1 through stage 4 chronic kidney disease, or unspecified chronic kidney disease: Secondary | ICD-10-CM | POA: Diagnosis not present

## 2021-12-13 DIAGNOSIS — R519 Headache, unspecified: Secondary | ICD-10-CM | POA: Diagnosis not present

## 2022-01-23 ENCOUNTER — Other Ambulatory Visit: Payer: Self-pay | Admitting: Internal Medicine

## 2022-01-23 DIAGNOSIS — Z1231 Encounter for screening mammogram for malignant neoplasm of breast: Secondary | ICD-10-CM

## 2022-01-27 DIAGNOSIS — H409 Unspecified glaucoma: Secondary | ICD-10-CM | POA: Diagnosis not present

## 2022-01-27 DIAGNOSIS — Z833 Family history of diabetes mellitus: Secondary | ICD-10-CM | POA: Diagnosis not present

## 2022-01-27 DIAGNOSIS — K219 Gastro-esophageal reflux disease without esophagitis: Secondary | ICD-10-CM | POA: Diagnosis not present

## 2022-01-27 DIAGNOSIS — Z823 Family history of stroke: Secondary | ICD-10-CM | POA: Diagnosis not present

## 2022-01-27 DIAGNOSIS — K5900 Constipation, unspecified: Secondary | ICD-10-CM | POA: Diagnosis not present

## 2022-01-27 DIAGNOSIS — Z8249 Family history of ischemic heart disease and other diseases of the circulatory system: Secondary | ICD-10-CM | POA: Diagnosis not present

## 2022-01-27 DIAGNOSIS — R69 Illness, unspecified: Secondary | ICD-10-CM | POA: Diagnosis not present

## 2022-01-27 DIAGNOSIS — M1990 Unspecified osteoarthritis, unspecified site: Secondary | ICD-10-CM | POA: Diagnosis not present

## 2022-01-27 DIAGNOSIS — Z803 Family history of malignant neoplasm of breast: Secondary | ICD-10-CM | POA: Diagnosis not present

## 2022-01-27 DIAGNOSIS — I10 Essential (primary) hypertension: Secondary | ICD-10-CM | POA: Diagnosis not present

## 2022-02-03 ENCOUNTER — Ambulatory Visit: Payer: Medicare HMO | Admitting: Podiatry

## 2022-02-12 ENCOUNTER — Ambulatory Visit: Payer: Medicare HMO | Admitting: Podiatry

## 2022-02-14 ENCOUNTER — Encounter: Payer: Self-pay | Admitting: Podiatry

## 2022-02-14 ENCOUNTER — Ambulatory Visit: Payer: Medicare HMO | Admitting: Podiatry

## 2022-02-14 DIAGNOSIS — L84 Corns and callosities: Secondary | ICD-10-CM | POA: Diagnosis not present

## 2022-02-14 DIAGNOSIS — I739 Peripheral vascular disease, unspecified: Secondary | ICD-10-CM

## 2022-02-14 DIAGNOSIS — M79675 Pain in left toe(s): Secondary | ICD-10-CM | POA: Diagnosis not present

## 2022-02-14 DIAGNOSIS — M79674 Pain in right toe(s): Secondary | ICD-10-CM

## 2022-02-14 DIAGNOSIS — B351 Tinea unguium: Secondary | ICD-10-CM | POA: Diagnosis not present

## 2022-02-14 NOTE — Progress Notes (Signed)
  Subjective:  Patient ID: [name] [name], female    DOB: 10/27/1931,  MRN: [id number]  [name] [name] presents to clinic today for painful mycotic toenails b/l feet. Patient states she also has painful caklluses and corns b/l feet. States she had flat feet all of her life, but refused to wear the special shoes as a teenager and this has resulted in her bilateral foot deformity. She still walks independently. Chief Complaint  Patient presents with   routine foot care    New problem(s): None.   PCP is Kelton Pillar, MD , and last visit was July 29, 2021.  Allergies  Allergen Reactions   Risedronate Sodium     Other reaction(s): Dizziness    Review of Systems: Negative except as noted in the HPI.  Objective: No changes noted in today's physical examination.  [name] [name] is a pleasant 88 y.o. female WD, WN in NAD. AAO x 3.  Vascular Examination: CFT <3 seconds b/l. DP pulses faintly palpable b/l. PT pulses nonpalpable b/l. Digital hair absent. Skin temperature gradient warm to warm b/l. No pain with calf compression. No ischemia or gangrene. No cyanosis or clubbing noted b/l. Nonpitting edema noted b/l lower extremities right >left.   Neurological Examination: Sensation grossly intact b/l with 10 gram monofilament. Vibratory sensation intact b/l.   Dermatological Examination: Pedal skin warm and supple b/l. Toenails 1-5 b/l thick, discolored, elongated with subungual debris and pain on dorsal palpation.  Hyperkeratotic lesion(s) distal tip of left 2nd toe, distal tip of left 3rd toe, submet head 1 right foot, and plantar midfoot b/l.  No erythema, no edema, no drainage, no fluctuance.  Musculoskeletal Examination: Muscle strength 5/5 to b/l LE.  Severe pes planovalgus deformity b/l LE. Hammertoe deformity noted 2-5 b/l. Patient ambulates independent of any assistive aids.  Radiographs: None  Assessment/Plan: 1. Pain due to onychomycosis of toenails of both feet   2.  Corns and callosities   3. PAD (peripheral artery disease) ([place])     No orders of the defined types were placed in this encounter.   -Patient was evaluated and treated. All patient's and/or POA's questions/concerns answered on today's visit. -Patient to continue soft, supportive shoe gear daily. -Toenails 1-5 b/l were debrided in length and girth with sterile nail nippers and dremel without iatrogenic bleeding.  -Callus(es) distal tip of left 2nd toe, distal tip of left 3rd toe, submet head 1 right foot, and plantar midfoot b/l pared utilizing rotary bur without complication or incident. Total number pared =5. -Patient/POA to call should there be question/concern in the interim.   Return in about 3 months (around 05/17/2022).  Marzetta Board, DPM

## 2022-03-04 ENCOUNTER — Ambulatory Visit: Payer: Medicare HMO

## 2022-03-10 DIAGNOSIS — Z23 Encounter for immunization: Secondary | ICD-10-CM | POA: Diagnosis not present

## 2022-03-25 DIAGNOSIS — H04123 Dry eye syndrome of bilateral lacrimal glands: Secondary | ICD-10-CM | POA: Diagnosis not present

## 2022-03-25 DIAGNOSIS — H26493 Other secondary cataract, bilateral: Secondary | ICD-10-CM | POA: Diagnosis not present

## 2022-03-25 DIAGNOSIS — H524 Presbyopia: Secondary | ICD-10-CM | POA: Diagnosis not present

## 2022-03-25 DIAGNOSIS — H20023 Recurrent acute iridocyclitis, bilateral: Secondary | ICD-10-CM | POA: Diagnosis not present

## 2022-03-25 DIAGNOSIS — H40023 Open angle with borderline findings, high risk, bilateral: Secondary | ICD-10-CM | POA: Diagnosis not present

## 2022-05-01 ENCOUNTER — Ambulatory Visit: Payer: Medicare HMO

## 2022-05-06 DIAGNOSIS — N183 Chronic kidney disease, stage 3 unspecified: Secondary | ICD-10-CM | POA: Diagnosis not present

## 2022-05-06 DIAGNOSIS — R400 Somnolence: Secondary | ICD-10-CM | POA: Diagnosis not present

## 2022-05-06 DIAGNOSIS — R0989 Other specified symptoms and signs involving the circulatory and respiratory systems: Secondary | ICD-10-CM | POA: Diagnosis not present

## 2022-05-06 DIAGNOSIS — R5383 Other fatigue: Secondary | ICD-10-CM | POA: Diagnosis not present

## 2022-05-06 DIAGNOSIS — E559 Vitamin D deficiency, unspecified: Secondary | ICD-10-CM | POA: Diagnosis not present

## 2022-05-06 DIAGNOSIS — I129 Hypertensive chronic kidney disease with stage 1 through stage 4 chronic kidney disease, or unspecified chronic kidney disease: Secondary | ICD-10-CM | POA: Diagnosis not present

## 2022-05-08 ENCOUNTER — Ambulatory Visit
Admission: RE | Admit: 2022-05-08 | Discharge: 2022-05-08 | Disposition: A | Discharge status: Home / Self Care | Payer: Medicare HMO | Source: Ambulatory Visit | Attending: Internal Medicine | Admitting: Internal Medicine

## 2022-05-08 DIAGNOSIS — Z1231 Encounter for screening mammogram for malignant neoplasm of breast: Secondary | ICD-10-CM

## 2022-05-08 DIAGNOSIS — Z780 Asymptomatic menopausal state: Secondary | ICD-10-CM | POA: Diagnosis not present

## 2022-05-08 DIAGNOSIS — M810 Age-related osteoporosis without current pathological fracture: Secondary | ICD-10-CM

## 2022-05-26 DIAGNOSIS — M810 Age-related osteoporosis without current pathological fracture: Secondary | ICD-10-CM | POA: Diagnosis not present

## 2022-05-26 DIAGNOSIS — G4719 Other hypersomnia: Secondary | ICD-10-CM | POA: Diagnosis not present

## 2022-05-26 DIAGNOSIS — H35033 Hypertensive retinopathy, bilateral: Secondary | ICD-10-CM | POA: Diagnosis not present

## 2022-05-26 DIAGNOSIS — I129 Hypertensive chronic kidney disease with stage 1 through stage 4 chronic kidney disease, or unspecified chronic kidney disease: Secondary | ICD-10-CM | POA: Diagnosis not present

## 2022-05-26 DIAGNOSIS — R252 Cramp and spasm: Secondary | ICD-10-CM | POA: Diagnosis not present

## 2022-05-26 DIAGNOSIS — N183 Chronic kidney disease, stage 3 unspecified: Secondary | ICD-10-CM | POA: Diagnosis not present

## 2022-05-26 DIAGNOSIS — M1990 Unspecified osteoarthritis, unspecified site: Secondary | ICD-10-CM | POA: Diagnosis not present

## 2022-06-03 ENCOUNTER — Encounter: Payer: Self-pay | Admitting: Podiatry

## 2022-06-03 ENCOUNTER — Ambulatory Visit (INDEPENDENT_AMBULATORY_CARE_PROVIDER_SITE_OTHER): Payer: Medicare HMO | Admitting: Podiatry

## 2022-06-03 DIAGNOSIS — I739 Peripheral vascular disease, unspecified: Secondary | ICD-10-CM | POA: Diagnosis not present

## 2022-06-03 DIAGNOSIS — L84 Corns and callosities: Secondary | ICD-10-CM | POA: Diagnosis not present

## 2022-06-03 DIAGNOSIS — M79674 Pain in right toe(s): Secondary | ICD-10-CM | POA: Diagnosis not present

## 2022-06-03 DIAGNOSIS — B351 Tinea unguium: Secondary | ICD-10-CM | POA: Diagnosis not present

## 2022-06-03 DIAGNOSIS — M79675 Pain in left toe(s): Secondary | ICD-10-CM | POA: Diagnosis not present

## 2022-06-03 NOTE — Progress Notes (Signed)
  Subjective:  Patient ID: [name] L Gasior, female    DOB: [birth year],  MRN: [id number]  [name] [name] presents to clinic today for at risk foot care. Patient has h/o PAD and corn(s) both feet and painful thick toenails that are difficult to trim. Painful toenails interfere with ambulation. Aggravating factors include wearing enclosed shoe gear. Pain is relieved with periodic professional debridement. Painful corns are aggravated when weightbearing when wearing enclosed shoe gear. Pain is relieved with periodic professional debridement.  Chief Complaint  Patient presents with   Nail Problem    RFC PCP-Pahwani PCP VST-04/2022   New problem(s): None.   PCP is Pahwani, Michell Heinrich, MD.  Allergies  Allergen Reactions   Risedronate Sodium     Other reaction(s): Dizziness   Review of Systems: Negative except as noted in the HPI.  Objective: No changes noted in today's physical examination. Vitals:   [name] [name] is a pleasant 89 y.o. female in NAD. AAO x 3.  Vascular Examination: CFT <3 seconds b/l. DP pulses faintly palpable b/l. PT pulses nonpalpable b/l. Digital hair absent. Skin temperature gradient warm to warm b/l. No pain with calf compression. No ischemia or gangrene. No cyanosis or clubbing noted b/l. Nonpitting edema noted b/l lower extremities right >left.   Neurological Examination: Sensation grossly intact b/l with 10 gram monofilament. Vibratory sensation intact b/l.   Dermatological Examination: Pedal skin warm and supple b/l. Toenails 1-5 b/l thick, discolored, elongated with subungual debris and pain on dorsal palpation.    Hyperkeratotic lesion(s) distal tip of left 2nd toe, distal tip of left 3rd toe, submet head 1 right foot, and plantar midfoot b/l.  No erythema, no edema, no drainage, no fluctuance.  Musculoskeletal Examination: Muscle strength 5/5 to b/l LE. Severe pes planovalgus deformity b/l LE. HAV with bunion b/l. Hammertoe deformity noted 2-5 b/l.  Patient ambulates independent of any assistive aids.  Radiographs: None  Assessment/Plan: 1. Pain due to onychomycosis of toenails of both feet   2. Corns and callosities   3. PAD (peripheral artery disease) ([place])   -Consent given for treatment as described below: -Examined patient. -Continue supportive shoe gear daily. -Mycotic toenails 1-5 bilaterally were debrided in length and girth with sterile nail nippers and dremel without incident. -Hyperkeratotic lesion(s) distal tip of left 2nd toe, distal tip of left 3rd toe, submet head 1 right foot, and plantar midfoot b/l debrided with dremel bur without incident. Total number debrided=5. -Dispensed toe cap. Apply to L 2nd toe every morning. Remove every evening. -Patient/POA to call should there be question/concern in the interim.   Return in about 3 months (around 09/01/2022).  Marzetta Board, DPM

## 2022-06-17 DIAGNOSIS — G4733 Obstructive sleep apnea (adult) (pediatric): Secondary | ICD-10-CM | POA: Diagnosis not present

## 2022-06-17 DIAGNOSIS — I10 Essential (primary) hypertension: Secondary | ICD-10-CM | POA: Diagnosis not present

## 2022-07-29 DIAGNOSIS — G4733 Obstructive sleep apnea (adult) (pediatric): Secondary | ICD-10-CM | POA: Diagnosis not present

## 2022-07-30 IMAGING — CR DG HIP (WITH OR WITHOUT PELVIS) 2-3V*R*
3 series · 3 of 3 positions shown · non-contrast
Comparison: 12/30/2013

CLINICAL DATA: Fall 1 month ago with persistent right thigh and
buttock pain, initial encounter

EXAM:
DG HIP (WITH OR WITHOUT PELVIS) 3V RIGHT

[t pelvis a.p.]
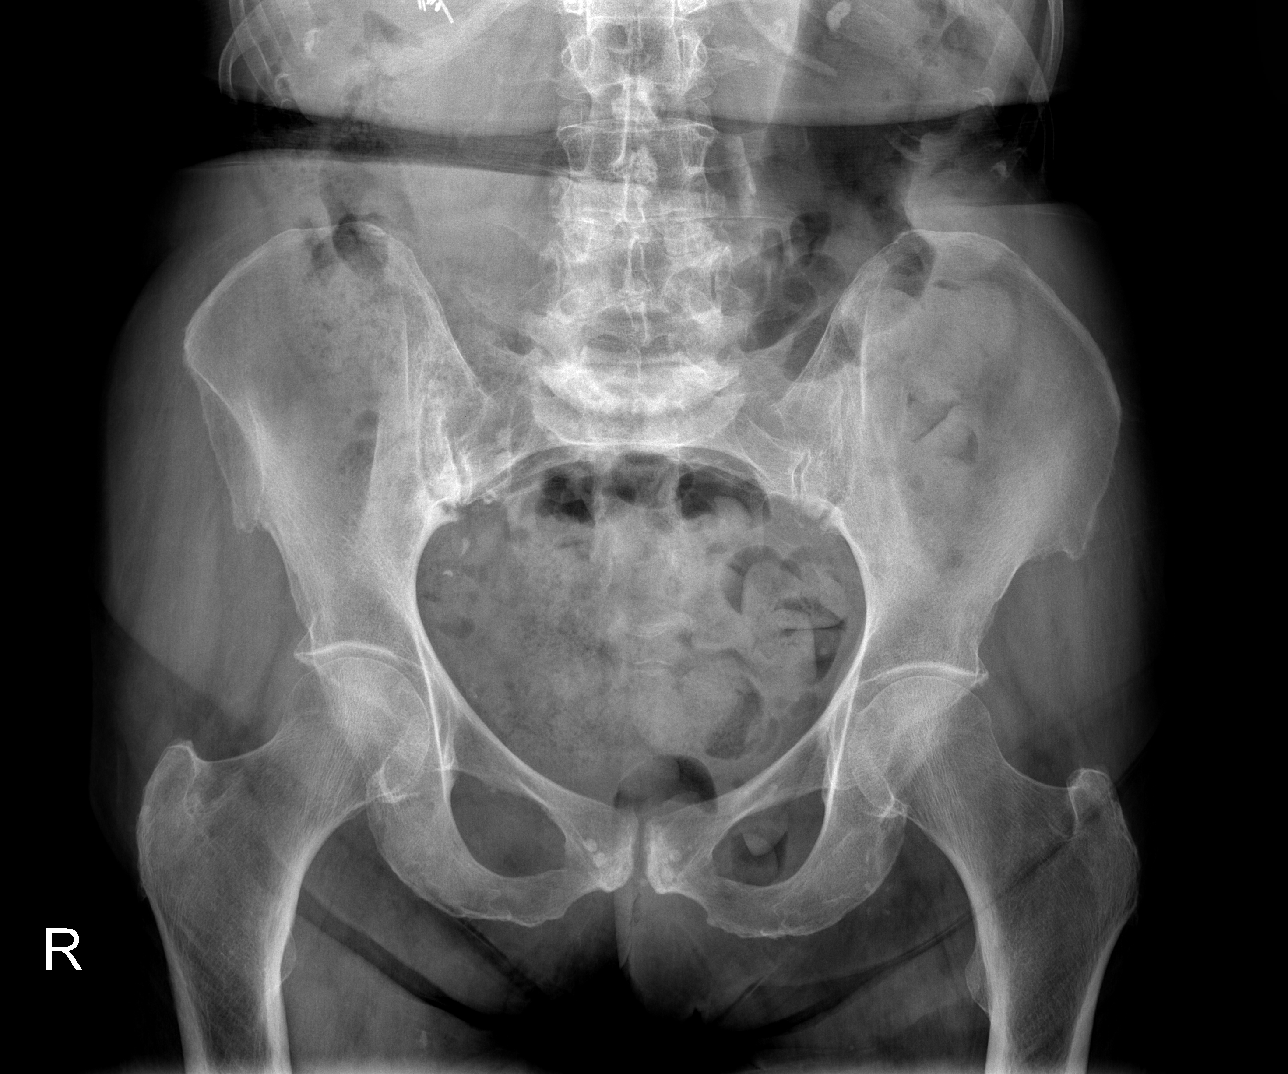

[t hip ap right]
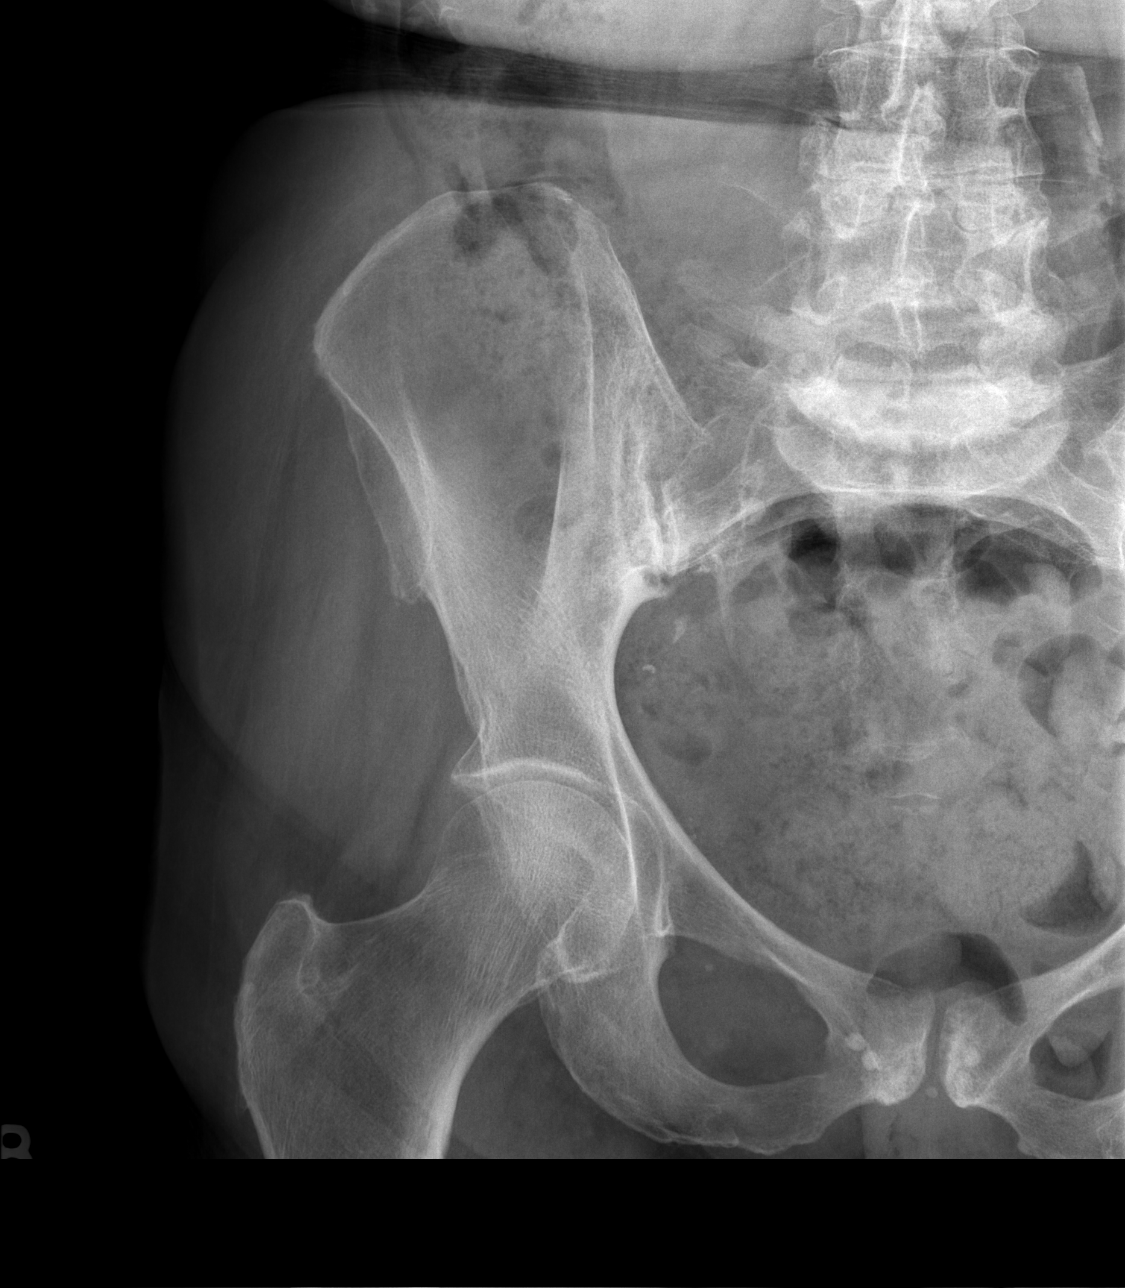

[t hip frog leg right]
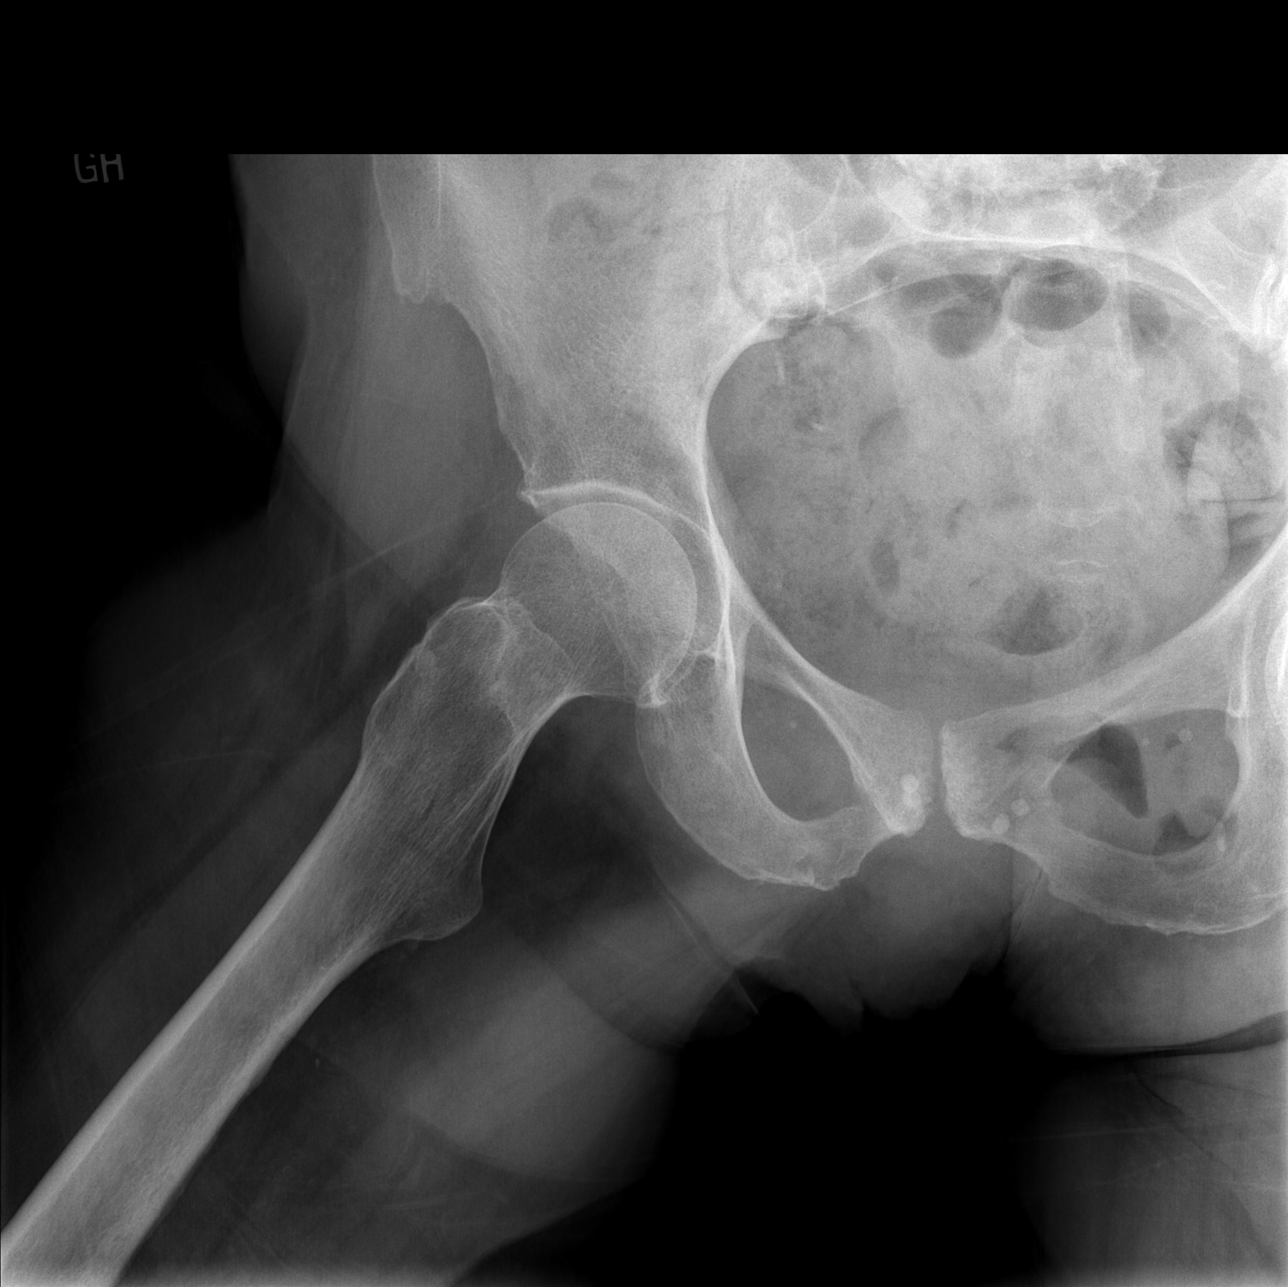

[3 of 3 positions shown; findings below may reference images not displayed]

FINDINGS: Pelvic ring is intact. Degenerative changes of lumbar spine are
noted. Mild degenerative changes of the hip joints are seen
bilaterally. No acute fracture or dislocation is noted. No soft
tissue abnormality is seen.
IMPRESSION: No acute abnormality noted.

## 2022-08-06 DIAGNOSIS — I129 Hypertensive chronic kidney disease with stage 1 through stage 4 chronic kidney disease, or unspecified chronic kidney disease: Secondary | ICD-10-CM | POA: Diagnosis not present

## 2022-08-06 DIAGNOSIS — N183 Chronic kidney disease, stage 3 unspecified: Secondary | ICD-10-CM | POA: Diagnosis not present

## 2022-08-06 DIAGNOSIS — R197 Diarrhea, unspecified: Secondary | ICD-10-CM | POA: Diagnosis not present

## 2022-08-28 DIAGNOSIS — G4733 Obstructive sleep apnea (adult) (pediatric): Secondary | ICD-10-CM | POA: Diagnosis not present

## 2022-09-18 DIAGNOSIS — H401123 Primary open-angle glaucoma, left eye, severe stage: Secondary | ICD-10-CM | POA: Diagnosis not present

## 2022-09-18 DIAGNOSIS — H524 Presbyopia: Secondary | ICD-10-CM | POA: Diagnosis not present

## 2022-09-18 DIAGNOSIS — Z961 Presence of intraocular lens: Secondary | ICD-10-CM | POA: Diagnosis not present

## 2022-09-22 DIAGNOSIS — I10 Essential (primary) hypertension: Secondary | ICD-10-CM | POA: Diagnosis not present

## 2022-09-22 DIAGNOSIS — G4733 Obstructive sleep apnea (adult) (pediatric): Secondary | ICD-10-CM | POA: Diagnosis not present

## 2022-09-23 ENCOUNTER — Ambulatory Visit: Payer: Medicare HMO | Admitting: Podiatry

## 2022-09-23 ENCOUNTER — Encounter: Payer: Self-pay | Admitting: Podiatry

## 2022-09-23 VITALS — BP 153/83 | HR 74

## 2022-09-23 DIAGNOSIS — M79675 Pain in left toe(s): Secondary | ICD-10-CM

## 2022-09-23 DIAGNOSIS — M79674 Pain in right toe(s): Secondary | ICD-10-CM | POA: Diagnosis not present

## 2022-09-23 DIAGNOSIS — I739 Peripheral vascular disease, unspecified: Secondary | ICD-10-CM

## 2022-09-23 DIAGNOSIS — B351 Tinea unguium: Secondary | ICD-10-CM

## 2022-09-23 DIAGNOSIS — L84 Corns and callosities: Secondary | ICD-10-CM

## 2022-09-23 NOTE — Progress Notes (Signed)
  Subjective:  Patient ID: [name] [name], female    DOB: 06-Dec-1931,  MRN: [id number]  [name] [name] presents to clinic today for at risk foot care. Patient has h/o PAD and corn(s) left foot and right foot and painful thick toenails that are difficult to trim. Painful toenails interfere with ambulation. Aggravating factors include wearing enclosed shoe gear. Pain is relieved with periodic professional debridement. Painful corns are aggravated when weightbearing when wearing enclosed shoe gear. Pain is relieved with periodic professional debridement. Chief Complaint  Patient presents with   Routine foot care     2nd & 3rd toe on left foot "painful when walking"    Toe Pain   New problem(s): None.   PCP is Pahwani, Kasandra Knudsen, MD.  Allergies  Allergen Reactions   Risedronate Sodium     Other reaction(s): Dizziness    Review of Systems: Negative except as noted in the HPI. Objective:   Vitals:   09/23/22 1435 09/23/22 1455  BP: (!) 171/83 (!) 153/83  Pulse: 78 74  SpO2: 97%    Constitutional [name] [name] is a pleasant 89 y.o. female, WD, WN in NAD. AAO x 3.   Vascular Vascular Examination: Capillary refill time immediate b/l. Vascular status intact b/l with palpable pedal pulses. Pedal hair absent b/l. No pain with calf compression b/l. Skin temperature gradient WNL b/l. No cyanosis or clubbing b/l. Dependent edema noted b/l LE.  Neurological Examination: Sensation grossly intact b/l with 10 gram monofilament. Vibratory sensation intact b/l.   Dermatological Examination: Pedal skin with normal turgor, texture and tone b/l.  No open wounds. No interdigital macerations.   Toenails 1-5 b/l thick, discolored, elongated with subungual debris and pain on dorsal palpation.   Hyperkeratotic lesion(s) L 2nd toe, L 3rd toe, submet head 1 right foot, and plantar midfoot b/l.  No erythema, no edema, no drainage, no fluctuance.  Musculoskeletal Examination: Muscle strength 5/5 to all  lower extremity muscle groups bilaterally. HAV with bunion bilaterally and hammertoes 2-5 b/l. Pes planovalgus deformity noted ,severe in nature b/l.  Radiographs: None   Assessment:   1. Pain due to onychomycosis of toenails of both feet   2. Corns and callosities   3. PAD (peripheral artery disease) ([facility])    Plan:  Patient was evaluated and treated and all questions answered. Consent given for treatment as described below: -Patient to continue soft, supportive shoe gear daily. -Toenails 1-5 b/l were debrided in length and girth with sterile nail nippers and dremel without iatrogenic bleeding.  -Corn(s) L 2nd toe and L 3rd toe pared utilizing sterile scalpel blade without complication or incident. Total number debrided=2. -Callus(es) submet head 1 right foot and plantar midfoot b/l pared utilizing sharp debridement with sterile blade without complication or incident. Total number debrided =3. -Continue padding  left foot to afftected digits daily for protection. -Patient/POA to call should there be question/concern in the interim.  Return in about 3 months (around 12/24/2022).  Freddie Breech, DPM

## 2022-09-24 ENCOUNTER — Ambulatory Visit: Payer: Medicare HMO | Admitting: Podiatry

## 2022-09-28 DIAGNOSIS — G4733 Obstructive sleep apnea (adult) (pediatric): Secondary | ICD-10-CM | POA: Diagnosis not present

## 2022-10-18 DIAGNOSIS — I10 Essential (primary) hypertension: Secondary | ICD-10-CM | POA: Diagnosis not present

## 2022-10-18 DIAGNOSIS — R600 Localized edema: Secondary | ICD-10-CM | POA: Diagnosis not present

## 2022-10-18 DIAGNOSIS — H409 Unspecified glaucoma: Secondary | ICD-10-CM | POA: Diagnosis not present

## 2022-10-18 DIAGNOSIS — J309 Allergic rhinitis, unspecified: Secondary | ICD-10-CM | POA: Diagnosis not present

## 2022-10-18 DIAGNOSIS — K5900 Constipation, unspecified: Secondary | ICD-10-CM | POA: Diagnosis not present

## 2022-10-18 DIAGNOSIS — R32 Unspecified urinary incontinence: Secondary | ICD-10-CM | POA: Diagnosis not present

## 2022-10-18 DIAGNOSIS — F324 Major depressive disorder, single episode, in partial remission: Secondary | ICD-10-CM | POA: Diagnosis not present

## 2022-10-18 DIAGNOSIS — I739 Peripheral vascular disease, unspecified: Secondary | ICD-10-CM | POA: Diagnosis not present

## 2022-10-18 DIAGNOSIS — M810 Age-related osteoporosis without current pathological fracture: Secondary | ICD-10-CM | POA: Diagnosis not present

## 2022-10-18 DIAGNOSIS — M1990 Unspecified osteoarthritis, unspecified site: Secondary | ICD-10-CM | POA: Diagnosis not present

## 2022-10-28 DIAGNOSIS — G4733 Obstructive sleep apnea (adult) (pediatric): Secondary | ICD-10-CM | POA: Diagnosis not present

## 2022-11-10 DIAGNOSIS — G4733 Obstructive sleep apnea (adult) (pediatric): Secondary | ICD-10-CM | POA: Diagnosis not present

## 2022-11-18 DIAGNOSIS — I739 Peripheral vascular disease, unspecified: Secondary | ICD-10-CM | POA: Diagnosis not present

## 2022-11-18 DIAGNOSIS — Z0000 Encounter for general adult medical examination without abnormal findings: Secondary | ICD-10-CM | POA: Diagnosis not present

## 2022-11-18 DIAGNOSIS — Z9989 Dependence on other enabling machines and devices: Secondary | ICD-10-CM | POA: Diagnosis not present

## 2022-11-18 DIAGNOSIS — M810 Age-related osteoporosis without current pathological fracture: Secondary | ICD-10-CM | POA: Diagnosis not present

## 2022-11-18 DIAGNOSIS — E559 Vitamin D deficiency, unspecified: Secondary | ICD-10-CM | POA: Diagnosis not present

## 2022-11-18 DIAGNOSIS — N183 Chronic kidney disease, stage 3 unspecified: Secondary | ICD-10-CM | POA: Diagnosis not present

## 2022-11-18 DIAGNOSIS — H35033 Hypertensive retinopathy, bilateral: Secondary | ICD-10-CM | POA: Diagnosis not present

## 2022-11-18 DIAGNOSIS — K7689 Other specified diseases of liver: Secondary | ICD-10-CM | POA: Diagnosis not present

## 2022-11-18 DIAGNOSIS — Z66 Do not resuscitate: Secondary | ICD-10-CM | POA: Diagnosis not present

## 2022-11-18 DIAGNOSIS — M1990 Unspecified osteoarthritis, unspecified site: Secondary | ICD-10-CM | POA: Diagnosis not present

## 2022-11-18 DIAGNOSIS — I129 Hypertensive chronic kidney disease with stage 1 through stage 4 chronic kidney disease, or unspecified chronic kidney disease: Secondary | ICD-10-CM | POA: Diagnosis not present

## 2022-11-18 DIAGNOSIS — K869 Disease of pancreas, unspecified: Secondary | ICD-10-CM | POA: Diagnosis not present

## 2022-11-20 ENCOUNTER — Encounter (HOSPITAL_COMMUNITY): Payer: Self-pay

## 2022-11-20 ENCOUNTER — Other Ambulatory Visit (HOSPITAL_COMMUNITY): Payer: Self-pay | Admitting: Internal Medicine

## 2022-11-20 ENCOUNTER — Ambulatory Visit (HOSPITAL_COMMUNITY): Admission: RE | Admit: 2022-11-20 | Payer: Medicare HMO | Source: Ambulatory Visit

## 2022-11-20 ENCOUNTER — Ambulatory Visit (HOSPITAL_COMMUNITY)
Admission: RE | Admit: 2022-11-20 | Discharge: 2022-11-20 | Disposition: A | Discharge status: Home / Self Care | Payer: Medicare HMO | Source: Ambulatory Visit | Attending: Internal Medicine | Admitting: Internal Medicine

## 2022-11-20 DIAGNOSIS — M7989 Other specified soft tissue disorders: Secondary | ICD-10-CM

## 2022-11-20 IMAGING — CR DG CHEST 2V
2 series · 2 of 2 positions shown · non-contrast
Comparison: AP Lat 11/02/2018.

CLINICAL DATA: Weakness and altered mental status.

EXAM:
CHEST - 2 VIEW

[chest lat]
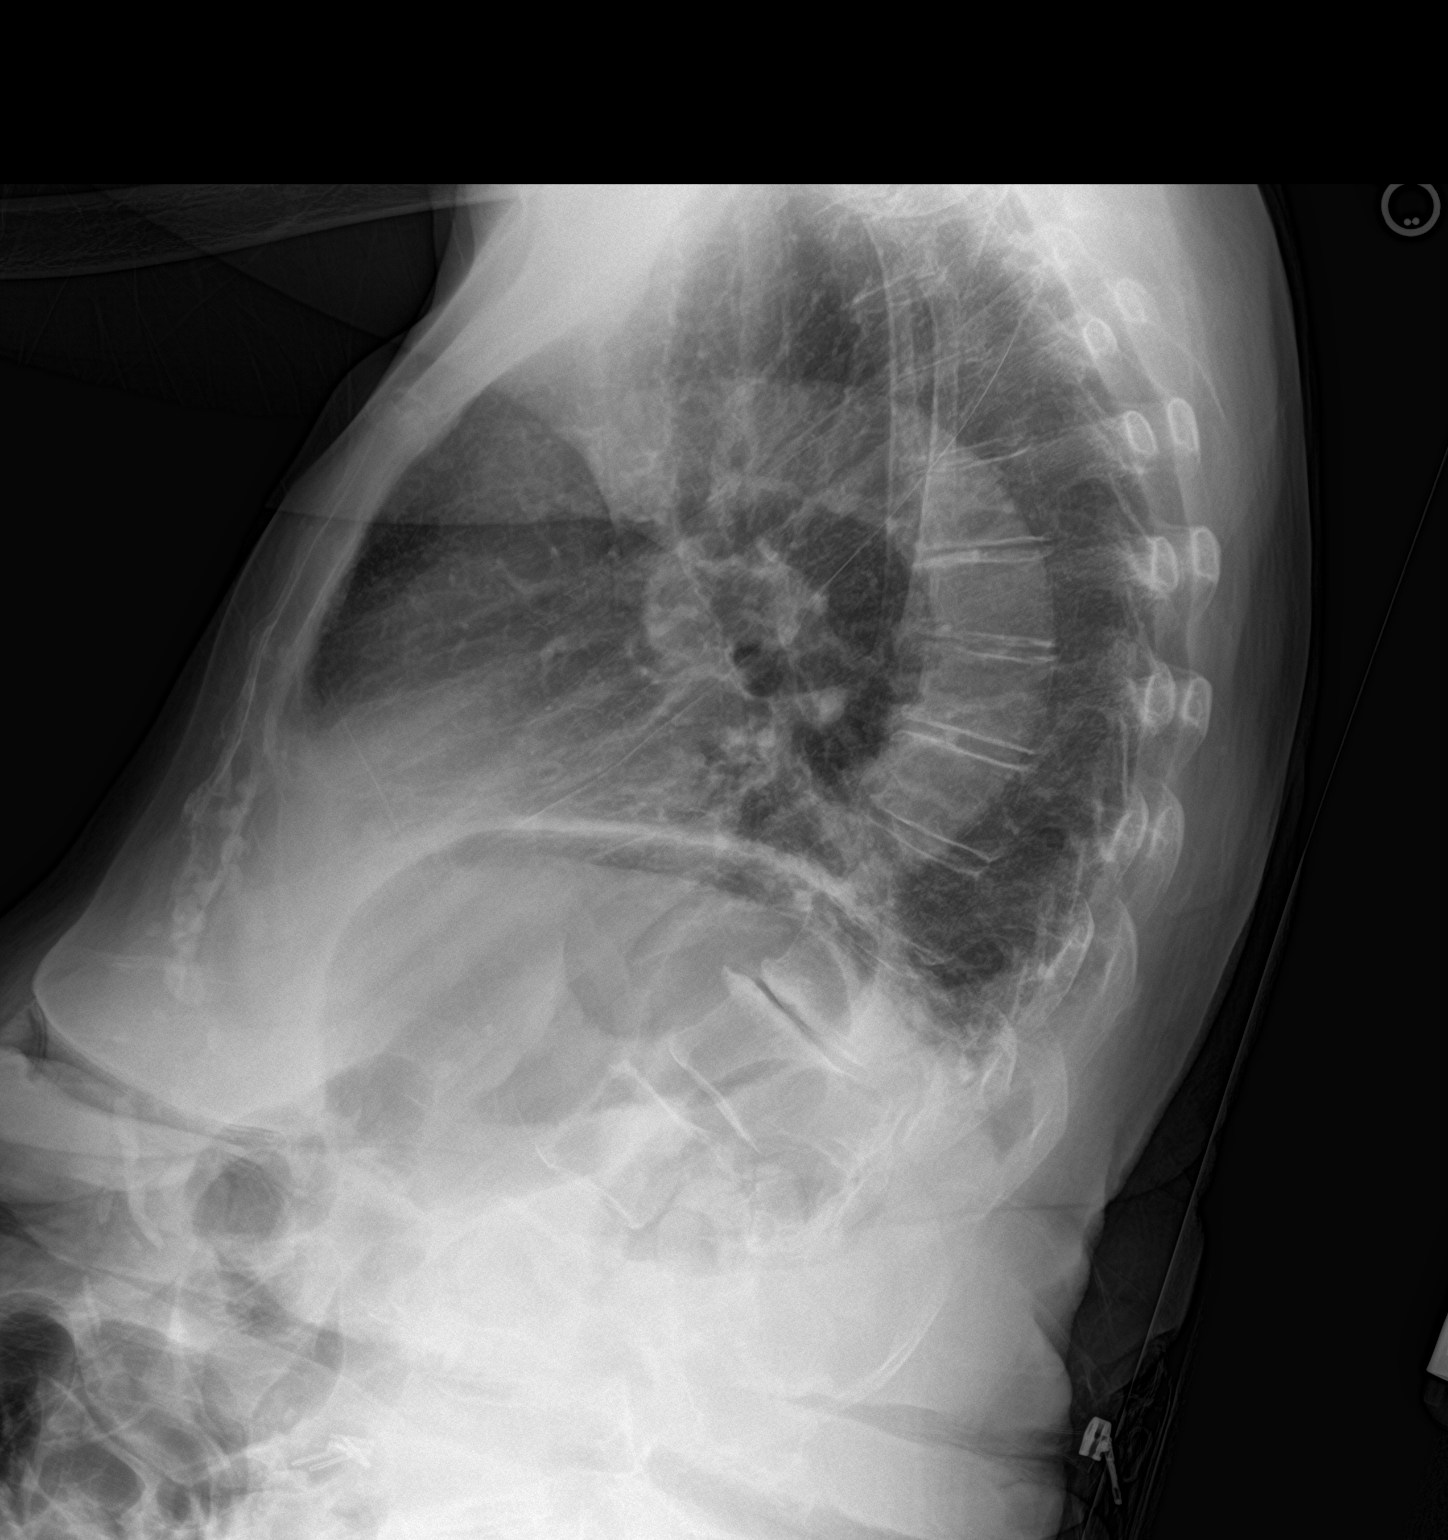

[chest ap]
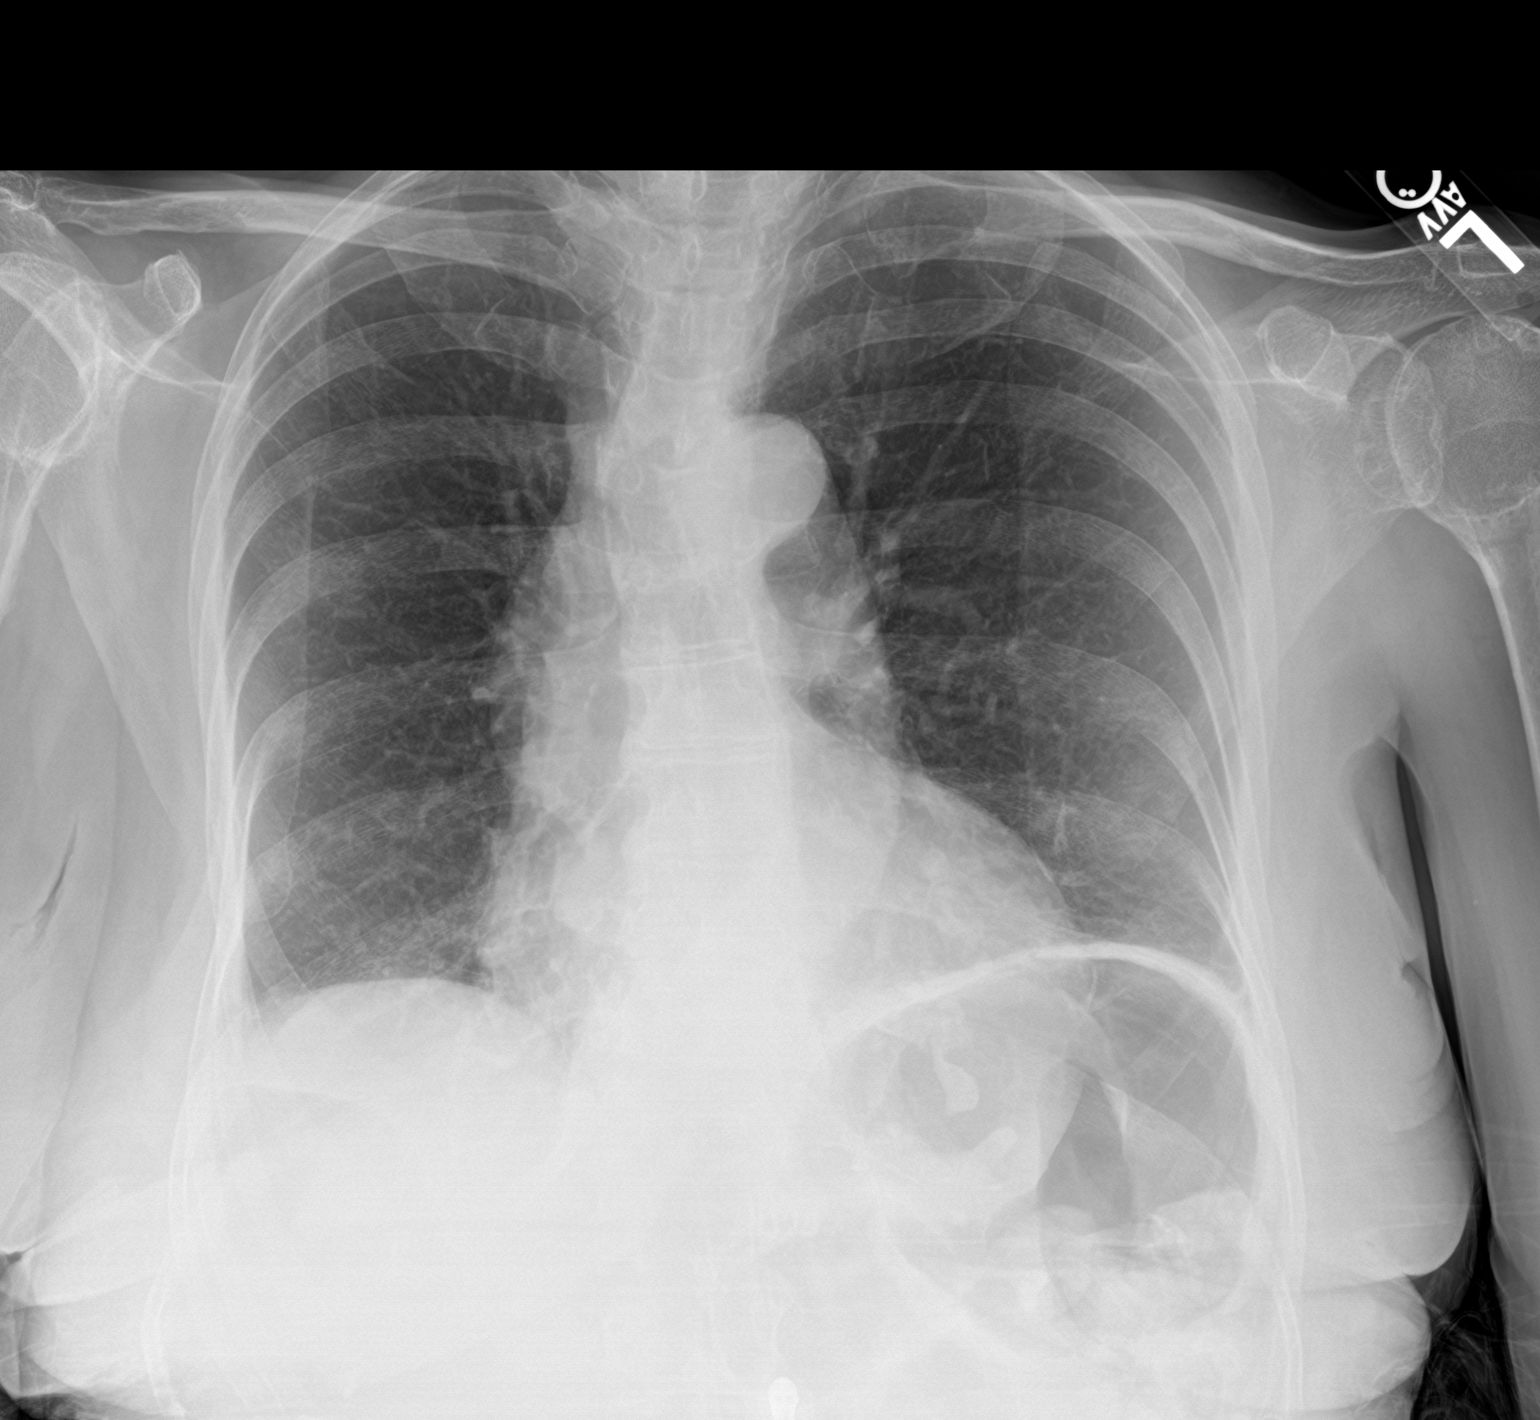

[2 of 2 positions shown; findings below may reference images not displayed]

FINDINGS: The cardiac size is upper-normal with normal central vessels. The
aorta is tortuous with scattered calcifications. Stable mediastinum.

The lungs are generally clear apart from scattered platelike
atelectasis in the bases, with interval relative decreased
inspiration. No pleural effusion is seen. Mild thoracic
kyphodextroscoliosis and osteopenia.
IMPRESSION: No evidence of acute chest disease. Aortic atherosclerosis and
uncoiling. Borderline heart size.

## 2022-11-24 DIAGNOSIS — I10 Essential (primary) hypertension: Secondary | ICD-10-CM | POA: Diagnosis not present

## 2022-11-24 DIAGNOSIS — G4733 Obstructive sleep apnea (adult) (pediatric): Secondary | ICD-10-CM | POA: Diagnosis not present

## 2022-11-28 DIAGNOSIS — G4733 Obstructive sleep apnea (adult) (pediatric): Secondary | ICD-10-CM | POA: Diagnosis not present

## 2022-12-29 DIAGNOSIS — G4733 Obstructive sleep apnea (adult) (pediatric): Secondary | ICD-10-CM | POA: Diagnosis not present

## 2023-01-13 ENCOUNTER — Ambulatory Visit: Payer: Medicare HMO | Admitting: Podiatry

## 2023-01-13 ENCOUNTER — Encounter: Payer: Self-pay | Admitting: Podiatry

## 2023-01-13 DIAGNOSIS — L84 Corns and callosities: Secondary | ICD-10-CM | POA: Diagnosis not present

## 2023-01-13 DIAGNOSIS — M79675 Pain in left toe(s): Secondary | ICD-10-CM | POA: Diagnosis not present

## 2023-01-13 DIAGNOSIS — I739 Peripheral vascular disease, unspecified: Secondary | ICD-10-CM | POA: Diagnosis not present

## 2023-01-13 DIAGNOSIS — B351 Tinea unguium: Secondary | ICD-10-CM

## 2023-01-13 DIAGNOSIS — M79674 Pain in right toe(s): Secondary | ICD-10-CM

## 2023-01-17 NOTE — Progress Notes (Signed)
Subjective:  Patient ID: [name] [name], female    DOB: 1931-05-04,  MRN: [id number]  [name] L Lafountain presents to clinic today for at risk foot care with h/o CKD and PAD and callus(es) of both feet and painful thick toenails that are difficult to trim. Painful toenails interfere with ambulation. Aggravating factors include wearing enclosed shoe gear. Pain is relieved with periodic professional debridement. Painful calluses are aggravated when weightbearing with and without shoegear. Pain is relieved with periodic professional debridement.  Chief Complaint  Patient presents with   RFC    RFC   New problem(s): None.   PCP is Pahwani, Kasandra Knudsen, MD.  Allergies  Allergen Reactions   Risedronate Sodium     Other reaction(s): Dizziness    Review of Systems: Negative except as noted in the HPI.  Objective: No changes noted in today's physical examination. There were no vitals filed for this visit. [name] L Roberds is a pleasant 89 y.o. female WD, WN in NAD. AAO x 3.  Vascular Examination: CFT <3 seconds b/l. DP/PT pulses palpable b/l. Skin temperature gradient warm to warm b/l. No pain with calf compression. No ischemia or gangrene. No cyanosis or clubbing noted b/l. Pedal hair absent.   Neurological Examination: Sensation grossly intact b/l with 10 gram monofilament. Vibratory sensation intact b/l.   Dermatological Examination: Pedal skin warm, thin and atrophic b/l.  No open wounds. No interdigital macerations.  Toenails 1-5 b/l thick, discolored, elongated with subungual debris and pain on dorsal palpation.    Hyperkeratotic lesion(s) plantar midfoot b/l.  No erythema, no edema, no drainage, no fluctuance.  Musculoskeletal Examination: Muscle strength 5/5 to all lower extremity muscle groups bilaterally. HAV with bunion bilaterally and hammertoes 2-5 b/l. Pes planovalgus deformity noted ,severe in nature b/l.  Radiographs: None  Assessment/Plan: 1. Pain due to onychomycosis  of toenails of both feet   2. Corns and callosities   3. PAD (peripheral artery disease) ([facility])     -Consent given for treatment as described below: -Examined patient. -Foot examined today. -Patient to continue soft, supportive shoe gear daily. -Toenails 1-5 b/l were debrided in length and girth with sterile nail nippers and dremel without iatrogenic bleeding.  -Callus(es) plantar midfoot b/l pared utilizing sterile scalpel blade without complication or incident. Total number debrided =2. -Patient/POA to call should there be question/concern in the interim.   Return in about 3 months (around 04/15/2023).  Freddie Breech, DPM

## 2023-01-19 DIAGNOSIS — G4733 Obstructive sleep apnea (adult) (pediatric): Secondary | ICD-10-CM | POA: Diagnosis not present

## 2023-01-28 DIAGNOSIS — G4733 Obstructive sleep apnea (adult) (pediatric): Secondary | ICD-10-CM | POA: Diagnosis not present

## 2023-02-03 DIAGNOSIS — Z23 Encounter for immunization: Secondary | ICD-10-CM | POA: Diagnosis not present

## 2023-02-28 DIAGNOSIS — G4733 Obstructive sleep apnea (adult) (pediatric): Secondary | ICD-10-CM | POA: Diagnosis not present

## 2023-03-04 DIAGNOSIS — B349 Viral infection, unspecified: Secondary | ICD-10-CM | POA: Diagnosis not present

## 2023-03-04 DIAGNOSIS — R52 Pain, unspecified: Secondary | ICD-10-CM | POA: Diagnosis not present

## 2023-03-04 DIAGNOSIS — I129 Hypertensive chronic kidney disease with stage 1 through stage 4 chronic kidney disease, or unspecified chronic kidney disease: Secondary | ICD-10-CM | POA: Diagnosis not present

## 2023-03-04 DIAGNOSIS — R590 Localized enlarged lymph nodes: Secondary | ICD-10-CM | POA: Diagnosis not present

## 2023-03-04 DIAGNOSIS — R54 Age-related physical debility: Secondary | ICD-10-CM | POA: Diagnosis not present

## 2023-03-20 DIAGNOSIS — I10 Essential (primary) hypertension: Secondary | ICD-10-CM | POA: Diagnosis not present

## 2023-03-20 DIAGNOSIS — D72818 Other decreased white blood cell count: Secondary | ICD-10-CM | POA: Diagnosis not present

## 2023-03-23 ENCOUNTER — Other Ambulatory Visit: Payer: Self-pay | Admitting: Internal Medicine

## 2023-03-23 DIAGNOSIS — K869 Disease of pancreas, unspecified: Secondary | ICD-10-CM

## 2023-03-26 DIAGNOSIS — G4733 Obstructive sleep apnea (adult) (pediatric): Secondary | ICD-10-CM | POA: Diagnosis not present

## 2023-03-30 DIAGNOSIS — G4733 Obstructive sleep apnea (adult) (pediatric): Secondary | ICD-10-CM | POA: Diagnosis not present

## 2023-04-24 ENCOUNTER — Encounter: Payer: Self-pay | Admitting: Internal Medicine

## 2023-04-27 ENCOUNTER — Other Ambulatory Visit: Payer: Self-pay | Admitting: Internal Medicine

## 2023-04-27 DIAGNOSIS — Z1231 Encounter for screening mammogram for malignant neoplasm of breast: Secondary | ICD-10-CM

## 2023-04-28 ENCOUNTER — Encounter: Payer: Self-pay | Admitting: Podiatry

## 2023-04-28 ENCOUNTER — Ambulatory Visit (INDEPENDENT_AMBULATORY_CARE_PROVIDER_SITE_OTHER): Payer: HMO | Admitting: Podiatry

## 2023-04-28 DIAGNOSIS — I739 Peripheral vascular disease, unspecified: Secondary | ICD-10-CM

## 2023-04-28 DIAGNOSIS — M79674 Pain in right toe(s): Secondary | ICD-10-CM | POA: Diagnosis not present

## 2023-04-28 DIAGNOSIS — B351 Tinea unguium: Secondary | ICD-10-CM

## 2023-04-28 DIAGNOSIS — M79675 Pain in left toe(s): Secondary | ICD-10-CM | POA: Diagnosis not present

## 2023-04-28 DIAGNOSIS — L84 Corns and callosities: Secondary | ICD-10-CM

## 2023-05-04 ENCOUNTER — Encounter: Payer: Self-pay | Admitting: Podiatry

## 2023-05-04 NOTE — Progress Notes (Signed)
  Subjective:  Patient ID: [name] [name], female    DOB: 1931/05/03,  MRN: [id number]  89 y.o. female presents to clinic with  at risk foot care. Patient has h/o PAD and corn(s) left foot, callus(es) of both feet and painful mycotic nails.  Pain interferes with ambulation. Aggravating factors include wearing enclosed shoe gear. Painful toenails interfere with ambulation. Aggravating factors include wearing enclosed shoe gear. Pain is relieved with periodic professional debridement. Painful corns and calluses are aggravated when weightbearing with and without shoegear. Pain is relieved with periodic professional debridement.  Chief Complaint  Patient presents with   Nail Problem    Patient states she last saw her PCP in the summer and she knows her PCP name    New problem(s): None   PCP is Pahwani, Rinka R, MD.  Allergies  Allergen Reactions   Risedronate Sodium     Other reaction(s): Dizziness   Review of Systems: Negative except as noted in the HPI.   Objective:  [name] [name] is a pleasant 89 y.o. female WD, WN in NAD. AAO x 3.   Vascular Examination: CFT <3 seconds b/l. DP pulses faintly palpable b/l. PT pulses nonpalpable b/l. Skin temperature gradient warm to warm b/l. No pain with calf compression. No ischemia or gangrene. No cyanosis or clubbing noted b/l. Pedal hair absent.   Neurological Examination: Sensation grossly intact b/l with 10 gram monofilament.  Dermatological Examination: Pedal skin with normal turgor, texture and tone b/l. Toenails 1-5 b/l thick, discolored, elongated with subungual debris and pain on dorsal palpation. Hyperkeratotic lesion(s) left second digit, left third digit, submet head 1 right foot, and plantar midfoot b/l.  No erythema, no edema, no drainage, no fluctuance.  Musculoskeletal Examination: Muscle strength 5/5 to b/l LE. Hallux valgus with bunion deformity noted of both feet. Hammertoe deformity noted 2-5 b/l. Pes planovalgus deformity  noted both feet.  Radiographs: None  Assessment:   1. Pain due to onychomycosis of toenails of both feet   2. Corns and callosities   3. PAD (peripheral artery disease) ([facility])    Plan:  -Consent given for treatment as described below: -Examined patient. -Continue supportive shoe gear daily. -Mycotic toenails 1-5 bilaterally were debrided in length and girth with sterile nail nippers and dremel without incident. -Corn(s) left second digit and left third digit pared utilizing sterile scalpel blade without complication or incident. Total number debrided=2. -Callus(es) submet head 1 right foot and plantar midfoot b/l pared utilizing sterile scalpel blade without complication or incident. Total number debrided =3. -Patient/POA to call should there be question/concern in the interim.  Return in about 3 months (around 07/27/2023).  [name] [name], DPM      [place] LOCATION: [address] [place] [address]                   Office [phone]   [facility] LOCATION: [address] [place], [place] [address] Office [phone]

## 2023-05-07 ENCOUNTER — Ambulatory Visit
Admission: RE | Admit: 2023-05-07 | Discharge: 2023-05-07 | Disposition: A | Discharge status: Home / Self Care | Payer: HMO | Source: Ambulatory Visit | Attending: Internal Medicine | Admitting: Internal Medicine

## 2023-05-07 DIAGNOSIS — K869 Disease of pancreas, unspecified: Secondary | ICD-10-CM

## 2023-05-07 MED ORDER — GADOPICLENOL 0.5 MMOL/ML IV SOLN
5.0000 mL | Freq: Once | INTRAVENOUS | Status: AC | PRN
  Administered 2023-05-07: 5 mL via INTRAVENOUS

## 2023-05-31 DIAGNOSIS — G4733 Obstructive sleep apnea (adult) (pediatric): Secondary | ICD-10-CM | POA: Diagnosis not present

## 2023-06-05 ENCOUNTER — Ambulatory Visit
Admission: RE | Admit: 2023-06-05 | Discharge: 2023-06-05 | Disposition: A | Discharge status: Home / Self Care | Payer: HMO | Source: Ambulatory Visit | Attending: Internal Medicine | Admitting: Internal Medicine

## 2023-06-05 DIAGNOSIS — Z1231 Encounter for screening mammogram for malignant neoplasm of breast: Secondary | ICD-10-CM

## 2023-08-18 ENCOUNTER — Ambulatory Visit: Payer: HMO | Admitting: Podiatry

## 2023-08-18 ENCOUNTER — Encounter: Payer: Self-pay | Admitting: Podiatry

## 2023-08-18 VITALS — Ht 62.0 in | Wt 125.0 lb

## 2023-08-18 DIAGNOSIS — L84 Corns and callosities: Secondary | ICD-10-CM | POA: Diagnosis not present

## 2023-08-18 DIAGNOSIS — M79674 Pain in right toe(s): Secondary | ICD-10-CM

## 2023-08-18 DIAGNOSIS — I739 Peripheral vascular disease, unspecified: Secondary | ICD-10-CM | POA: Diagnosis not present

## 2023-08-18 DIAGNOSIS — M79675 Pain in left toe(s): Secondary | ICD-10-CM

## 2023-08-18 DIAGNOSIS — B351 Tinea unguium: Secondary | ICD-10-CM

## 2023-08-23 NOTE — Progress Notes (Signed)
  Subjective:  Patient ID: [name]  Angelica [name], female    DOB: 1932-04-01,  MRN: [id number]  [name] [name] presents to clinic today for at risk foot care. Patient has h/o PAD and corn(s) of both feet, callus(es) b/l feet and painful elongated, discolored, dystrophic nails.  Pain interferes with ambulation. Aggravating factors include wearing enclosed shoe gear. Painful toenails interfere with ambulation. Aggravating factors include wearing enclosed shoe gear. Pain is relieved with periodic professional debridement. Painful corns and calluses are aggravated when weightbearing with and without shoegear. Pain is relieved with periodic professional debridement.  Chief Complaint  Patient presents with   Nail Problem    Pt is here for [facility] PCP is Dr Lilyan Remedies and LOV was in December.   New problem(s): None.   PCP is Pahwani, Regino Caprio, MD.  Allergies  Allergen Reactions   Risedronate Sodium     Other reaction(s): Dizziness    Review of Systems: Negative except as noted in the HPI.  Objective: No changes noted in today's physical examination. There were no vitals filed for this visit. [name] [name] is a pleasant 89 y.o. female WD, WN in NAD. AAO x 3.  Vascular Examination: CFT <3 seconds b/l. DP pulses faintly palpable b/l. PT pulses nonpalpable b/l. Skin temperature gradient warm to warm b/l. No pain with calf compression. No ischemia or gangrene. No cyanosis or clubbing noted b/l. Pedal hair absent.   Neurological Examination: Sensation grossly intact b/l with 10 gram monofilament.  Dermatological Examination: Pedal skin with normal turgor, texture and tone b/l. Toenails 1-5 b/l thick, discolored, elongated with subungual debris and pain on dorsal palpation. Hyperkeratotic lesion(s) left second digit, left third digit, submet head 1 right foot, and plantar midfoot b/l.  No erythema, no edema, no drainage, no fluctuance.  Musculoskeletal Examination: Muscle strength 5/5 to b/l LE. Hallux  valgus with bunion deformity noted of both feet. Hammertoe deformity noted 2-5 b/l. Pes planovalgus deformity noted both feet.  Radiographs: None  Assessment/Plan: 1. Pain due to onychomycosis of toenails of both feet   2. Corns and callosities   3. PAD (peripheral artery disease) ([facility])   Patient was evaluated and treated. All patient's and/or POA's questions/concerns addressed on today's visit. Toenails 1-5 debrided in length and girth without incident. Corn(s) and Callus(es) L 2nd toe, submet head 1 right foot, and plantar midfoot b/l pared with sharp debridement without incident. Continue soft, supportive shoe gear daily. Report any pedal injuries to medical professional. Call office if there are any questions/concerns. -Patient/POA to call should there be question/concern in the interim.   Return in about 3 months (around 11/18/2023).  [name] [name], DPM      [place] LOCATION: [address], [place] [address]                   Office [phone]   [facility] LOCATION: [address] [place], [place] [address] Office [phone]

## 2023-11-03 ENCOUNTER — Emergency Department (HOSPITAL_COMMUNITY)

## 2023-11-03 ENCOUNTER — Emergency Department (HOSPITAL_COMMUNITY)
Admission: EM | Admit: 2023-11-03 | Discharge: 2023-11-03 | Disposition: A | Discharge status: Home / Self Care | Attending: Emergency Medicine | Admitting: Emergency Medicine

## 2023-11-03 ENCOUNTER — Ambulatory Visit (HOSPITAL_COMMUNITY): Admission: EM | Admit: 2023-11-03 | Discharge: 2023-11-03 | Disposition: A | Discharge status: Home / Self Care

## 2023-11-03 ENCOUNTER — Encounter (HOSPITAL_COMMUNITY): Payer: Self-pay

## 2023-11-03 ENCOUNTER — Other Ambulatory Visit: Payer: Self-pay

## 2023-11-03 DIAGNOSIS — Z7982 Long term (current) use of aspirin: Secondary | ICD-10-CM | POA: Insufficient documentation

## 2023-11-03 DIAGNOSIS — K115 Sialolithiasis: Secondary | ICD-10-CM | POA: Diagnosis not present

## 2023-11-03 DIAGNOSIS — K1120 Sialoadenitis, unspecified: Secondary | ICD-10-CM | POA: Insufficient documentation

## 2023-11-03 DIAGNOSIS — K118 Other diseases of salivary glands: Secondary | ICD-10-CM | POA: Diagnosis not present

## 2023-11-03 DIAGNOSIS — I10 Essential (primary) hypertension: Secondary | ICD-10-CM | POA: Insufficient documentation

## 2023-11-03 DIAGNOSIS — I6523 Occlusion and stenosis of bilateral carotid arteries: Secondary | ICD-10-CM | POA: Diagnosis not present

## 2023-11-03 DIAGNOSIS — R221 Localized swelling, mass and lump, neck: Secondary | ICD-10-CM | POA: Diagnosis not present

## 2023-11-03 DIAGNOSIS — M4312 Spondylolisthesis, cervical region: Secondary | ICD-10-CM | POA: Diagnosis not present

## 2023-11-03 LAB — CBC WITH DIFFERENTIAL/PLATELET
Abs Immature Granulocytes: 0 K/uL (ref 0.00–0.07)
Basophils Absolute: 0 K/uL (ref 0.0–0.1)
Basophils Relative: 1 %
Eosinophils Absolute: 0.1 K/uL (ref 0.0–0.5)
Eosinophils Relative: 3 %
HCT: 38.6 % (ref 36.0–46.0)
Hemoglobin: 12.3 g/dL (ref 12.0–15.0)
Immature Granulocytes: 0 %
Lymphocytes Relative: 32 %
Lymphs Abs: 1.3 K/uL (ref 0.7–4.0)
MCH: 31.1 pg (ref 26.0–34.0)
MCHC: 31.9 g/dL (ref 30.0–36.0)
MCV: 97.5 fL (ref 80.0–100.0)
Monocytes Absolute: 0.4 K/uL (ref 0.1–1.0)
Monocytes Relative: 10 %
Neutro Abs: 2.3 K/uL (ref 1.7–7.7)
Neutrophils Relative %: 54 %
Platelets: 266 K/uL (ref 150–400)
RBC: 3.96 MIL/uL (ref 3.87–5.11)
RDW: 12.4 % (ref 11.5–15.5)
WBC: 4.2 K/uL (ref 4.0–10.5)
nRBC: 0 % (ref 0.0–0.2)

## 2023-11-03 LAB — COMPREHENSIVE METABOLIC PANEL WITH GFR
ALT: 14 U/L (ref 0–44)
AST: 21 U/L (ref 15–41)
Albumin: 4 g/dL (ref 3.5–5.0)
Alkaline Phosphatase: 42 U/L (ref 38–126)
Anion gap: 10 (ref 5–15)
BUN: 16 mg/dL (ref 8–23)
CO2: 27 mmol/L (ref 22–32)
Calcium: 10 mg/dL (ref 8.9–10.3)
Chloride: 99 mmol/L (ref 98–111)
Creatinine, Ser: 0.84 mg/dL (ref 0.44–1.00)
GFR, Estimated: >60 mL/min (ref >60)
Glucose, Bld: 98 mg/dL (ref 70–99)
Potassium: 3.8 mmol/L (ref 3.5–5.1)
Sodium: 136 mmol/L (ref 135–145)
Total Bilirubin: 0.6 mg/dL (ref 0.0–1.2)
Total Protein: 7.9 g/dL (ref 6.5–8.1)

## 2023-11-03 LAB — I-STAT CG4 LACTIC ACID, ED: Lactic Acid, Venous: 0.4 mmol/L — ABNORMAL LOW (ref 0.5–1.9)

## 2023-11-03 MED ORDER — AMOXICILLIN-POT CLAVULANATE 875-125 MG PO TABS: 1.0000 | ORAL_TABLET | Freq: Two times a day (BID) | ORAL | 0 refills | Status: AC

## 2023-11-03 MED ORDER — ACETAMINOPHEN 500 MG PO TABS
1000.0000 mg | ORAL_TABLET | Freq: Once | ORAL | Status: AC
Start: 2023-11-03 — End: 2023-11-03
  Administered 2023-11-03: 1000 mg via ORAL
  Filled 2023-11-03: qty 2

## 2023-11-03 MED ORDER — IOHEXOL 350 MG/ML SOLN
75.0000 mL | Freq: Once | INTRAVENOUS | Status: AC | PRN
  Administered 2023-11-03: 75 mL via INTRAVENOUS

## 2023-11-03 NOTE — ED Triage Notes (Signed)
 Patient reports that she has a swollen lymph gland under the left jaw x 3 weeks. Patient states it is very tender at times,but today it is 2/10.  Patient states she has been gargling with salt water.

## 2023-11-03 NOTE — ED Notes (Signed)
 Patient is being discharged from the Urgent Care and sent to the Emergency Department via self . Per provider, patient is in need of higher level of care due to left neck mass. Patient is aware and verbalizes understanding of plan of care.  Vitals:   11/03/23 1512  BP: (!) 166/85  Pulse: 64  Resp: 16  Temp: 98.1 F (36.7 C)  SpO2: 98%

## 2023-11-03 NOTE — ED Provider Notes (Signed)
 [facility] Provider Note   CSN: [id number] Arrival date & time: 11/03/23  1618     Patient presents with: Sore Throat   [name] [name]  L How is a 89 y.o. female history of hypertension presents with complaints of soft tissue mass on the left side of her neck.  Does describe some discomfort to the area as well as to her jaw.  Notably worse with eating.  Denies any difficulty swallowing or breathing.  Was evaluated urgent care and referred here for further evaluation.    Sore Throat   Past Medical History:  Diagnosis Date   Dry eyes    Heart murmur    Hypertension    Past Surgical History:  Procedure Laterality Date   ABDOMINAL HYSTERECTOMY     BREAST BIOPSY Right 05/20/2013   CATARACT EXTRACTION     CHOLECYSTECTOMY     EYE SURGERY     HERNIA REPAIR     TONSILLECTOMY         Prior to Admission medications   Medication Sig Start Date End Date Taking? Authorizing Provider  amoxicillin -clavulanate (AUGMENTIN ) 875-125 MG tablet Take 1 tablet by mouth every 12 (twelve) hours. 11/03/23  Yes Donnajean Lynwood DEL, PA-C  acetaminophen  (TYLENOL ) 500 MG tablet Take 1 tablet (500 mg total) by mouth every 6 (six) hours as needed. Patient taking differently: Take 500 mg by mouth every 6 (six) hours as needed for mild pain (pain score 1-3). 06/11/18   Burky, Natalie B, NP  ADVIL 200 MG CAPS Take 200 mg by mouth 2 (two) times daily. 08/25/19   [provider]  Artificial Tear Ointment (ARTIFICIAL TEARS) ointment Place 1 drop into both eyes as needed (dry eyes).     [provider]  aspirin  EC 325 MG tablet Take 1 tablet (325 mg total) by mouth daily. 03/17/17   Stover, Titorya, DPM  benzonatate (TESSALON) 100 MG capsule 1 capsule as needed Patient not taking: Reported on 11/03/2023 06/14/21   [provider]  Cholecalciferol (VITAMIN D PO) Take 1,000 Units by mouth daily.     [provider]  ferrous sulfate 325 (65  FE) MG EC tablet Take 325 mg by mouth daily. Patient not taking: Reported on 11/03/2023    [provider]  fluticasone (FLONASE) 50 MCG/ACT nasal spray Place into both nostrils daily.    [provider]  hydrochlorothiazide (HYDRODIURIL) 12.5 MG tablet Take 12.5 mg by mouth every morning. 05/31/19   [provider]  polyethylene glycol (MIRALAX / GLYCOLAX) 17 g packet Take 17 g by mouth daily as needed for mild constipation.    [provider]  Polyvinyl Alcohol-Povidone PF (REFRESH) 1.4-0.6 % SOLN Apply 1 drop to eye as needed (dry eyes). Refresh    [provider]  prednisoLONE acetate (PRED FORTE) 1 % ophthalmic suspension Place 1 drop into the left eye daily.  Patient not taking: Reported on 11/03/2023 12/19/16   [provider]  timolol (TIMOPTIC) 0.5 % ophthalmic solution Place 1 drop into the left eye 2 (two) times daily. 05/14/21   [provider]    Allergies: Risedronate sodium    Review of Systems  HENT:  Negative for trouble swallowing.     Updated Vital Signs BP (!) 183/108 (BP Location: Left Arm)   Pulse 65   Temp 98.7 F (37.1 C)   Resp 18   SpO2 100%   Physical Exam Vitals and nursing note reviewed.  Constitutional:  General: She is not in acute distress.    Appearance: She is well-developed.  HENT:     Head: Normocephalic and atraumatic.     Comments: Left-sided submandibular soft tissue mass, mobile, minimally tender, no surrounding erythema or warmth, nonfluctuant, no induration or crepitus  Airway is patent, no obvious dental abscess appreciated, no evidence of peritonsillar abscess, no trismus or normal phonation Eyes:     Conjunctiva/sclera: Conjunctivae normal.  Cardiovascular:     Rate and Rhythm: Normal rate and regular rhythm.     Heart sounds: No murmur heard. Pulmonary:     Effort: Pulmonary effort is normal. No respiratory distress.     Breath sounds: Normal breath sounds.  Abdominal:      Palpations: Abdomen is soft.     Tenderness: There is no abdominal tenderness.  Musculoskeletal:     Cervical back: Neck supple.  Skin:    General: Skin is warm and dry.     Capillary Refill: Capillary refill takes less than 2 seconds.  Neurological:     Mental Status: She is alert.  Psychiatric:        Mood and Affect: Mood normal.     (all labs ordered are listed, but only abnormal results are displayed) Labs Reviewed  I-STAT CG4 LACTIC ACID, ED - Abnormal; Notable for the following components:      Result Value   Lactic Acid, Venous 0.4 (*)    All other components within normal limits  COMPREHENSIVE METABOLIC PANEL WITH GFR  CBC WITH DIFFERENTIAL/PLATELET  I-STAT CG4 LACTIC ACID, ED    EKG: None  Radiology: CT Soft Tissue Neck W Contrast Result Date: 11/03/2023 EXAM: CT NECK WITH CONTRAST 11/03/2023 09:38:30 PM TECHNIQUE: CT of the neck was performed with the administration of intravenous contrast. Multiplanar reformatted images are provided for review. Automated exposure control, iterative reconstruction, and/or weight based adjustment of the mA/kV was utilized to reduce the radiation dose to as low as reasonably achievable. COMPARISON: None available. CLINICAL HISTORY: Left sided neck soft tissue mass. FINDINGS: AERODIGESTIVE TRACT: No discrete mass. No edema. SALIVARY GLANDS: There is hyper-enhancement of the left submandibular gland. The left submandibular duct is dilated to 6 mm. There is a distal left submandibular duct calculus measuring 5 mm. THYROID : Unremarkable. LYMPH NODES: No suspicious cervical lymphadenopathy. SOFT TISSUES: No mass or fluid collection. BRAIN, ORBITS, SINUSES AND MASTOIDS: No acute abnormality. LUNGS AND MEDIASTINUM: No acute abnormality. BONES: There is reversal of normal cervical lordosis. Grade 1 anterolisthesis at C3-4 and C4-5 with grade 1 retrolisthesis at C5-6. VASCULATURE: Calcific atherosclerosis at the carotid bifurcations. IMPRESSION: 1.  Inflammatory change of the left submandibular gland with dilated duct (6 mm) and distal duct calculus (5 mm). Electronically signed by: Franky Stanford MD 11/03/2023 10:46 PM EDT RP Workstation: [id number]     Procedures   Medications Ordered in the ED  acetaminophen  (TYLENOL ) tablet 1,000 mg (has no administration in time range)  iohexol  (OMNIPAQUE ) 350 MG/ML injection 75 mL (75 mLs Intravenous Contrast Given 11/03/23 2139)                                    Medical Decision Making Amount and/or Complexity of Data Reviewed Labs: ordered. Radiology: ordered.  Risk Prescription drug management.   This patient presents to the ED with chief complaint(s) of neck mass.  The complaint involves an extensive differential diagnosis and also carries with it a high risk of  complications and morbidity.   Pertinent past medical history as listed in HPI  The differential diagnosis includes  Lymphadenopathy, abscess, lipoma, sialoadenitis, Ludwig's angina, dental abscess Additional history obtained: Additional history obtained from family Records reviewed Care Everywhere/External Records  Assessment and management:   Nontoxic-appearing patient presenting with complaints of soft tissue mass on the left side of her neck x 3 weeks.  Has no difficulty swallowing or breathing.  She has no trismus reports secretions.  On exam she does have a mobile mildly tender submandibular soft tissue mass consistent with lymphadenopathy.  Additionally appears to have a salivary stone noted.  Given age and significant lymphadenopathy will obtain CT imaging.  Consistent with calculus.  Will send in Augmentin  and recommended tart sialogogues.  Will provide ENT follow-up.  Also noted to be hypertensive.  Patient states she did take her blood pressure medication today.  Lab work without evidence of endorgan damage.  Encouraged to follow-up with PCP.  Independent ECG interpretation:  None  Independent labs interpretation:   The following labs were independently interpreted:  CBC and CMP unremarkable, lactic without elevation  Independent visualization and interpretation of imaging: I independently visualized the following imaging with scope of interpretation limited to determining acute life threatening conditions related to emergency care:  CT soft tissue neck inflammatory change to the left submandibular gland with dilated duct 6 mm and distal duct calculus   Consultations obtained:   none  Disposition:   Patient will be discharged home. The patient has been appropriately medically screened and/or stabilized in the ED. I have low suspicion for any other emergent medical condition which would require further screening, evaluation or treatment in the ED or require inpatient management. At time of discharge the patient is hemodynamically stable and in no acute distress. I have discussed work-up results and diagnosis with patient and answered all questions. Patient is agreeable with discharge plan. We discussed strict return precautions for returning to the emergency department and they verbalized understanding.     Social Determinants of Health:   Patient's impaired access to primary care  increases the complexity of managing their presentation  This note was dictated with voice recognition software.  Despite best efforts at proofreading, errors may have occurred which can change the documentation meaning.       Final diagnoses:  Sialadenitis  Sialolithiasis    ED Discharge Orders          Ordered    amoxicillin -clavulanate (AUGMENTIN ) 875-125 MG tablet  Every 12 hours        11/03/23 2259               Donnajean Lynwood DEL, PA-C 11/03/23 2309    Jerrol Lynwood, MD 11/04/23 1504

## 2023-11-03 NOTE — Discharge Instructions (Addendum)
 You were evaluated in the emergency room for mouth pain and throat swelling.  Your exam was consistent with a stone in your and your salivary gland.  A prescription for antibiotics was sent in to your pharmacy for infection.  I would recommend utilizing tart drops regularly such as lemon drops to help your salivary gland and express the stone out.  You were provided a referral for ENT.

## 2023-11-03 NOTE — ED Triage Notes (Signed)
 Patient c/o swollen lymphnode under tongue and in L jaw. Patient has noted stone in salivary gland on affected side. Patient does not have tonsils. No fever, discomfort noted x 3 weeks but intense soreness noted this morning.

## 2023-11-03 NOTE — ED Provider Notes (Signed)
 [facility]    CSN: [id number] Arrival date & time: 11/03/23  1416      History   Chief Complaint Chief Complaint  Patient presents with   Lymphadenopathy    HPI [name] [name] is a 89 y.o. female.   89 year old female who presents to urgent care with complaints of a swollen, tender area under her left jaw along the neck.  This started about 3 weeks ago.  She does not recall having any illness prior to this.  The only other symptom she has had associated with this is a headache.  She denies any fevers, chills, nausea, vomiting, sore throat, dental pain, shortness of breath, chest pain.  She does report that she had some dental work done about 2 months ago and feels like there is still an uneven area on her teeth but denies any pain in the teeth.        Past Medical History:  Diagnosis Date   Dry eyes    Heart murmur    Hypertension     Patient Active Problem List   Diagnosis Date Noted   Chronic arthropathy 11/04/2021    Past Surgical History:  Procedure Laterality Date   ABDOMINAL HYSTERECTOMY     BREAST BIOPSY Right 05/20/2013   CATARACT EXTRACTION     CHOLECYSTECTOMY     EYE SURGERY     HERNIA REPAIR     TONSILLECTOMY      OB History   No obstetric history on file.      Home Medications    Prior to Admission medications   Medication Sig Start Date End Date Taking? Authorizing Provider  acetaminophen  (TYLENOL ) 500 MG tablet Take 1 tablet (500 mg total) by mouth every 6 (six) hours as needed. Patient taking differently: Take 500 mg by mouth every 6 (six) hours as needed for mild pain (pain score 1-3). 06/11/18   Burky, Natalie B, NP  ADVIL 200 MG CAPS Take 200 mg by mouth 2 (two) times daily. 08/25/19   [provider]  Artificial Tear Ointment (ARTIFICIAL TEARS) ointment Place 1 drop into both eyes as needed (dry eyes).     [provider]  aspirin  EC 325 MG tablet Take 1 tablet (325 mg total) by mouth daily. 03/17/17    Stover, Titorya, DPM  benzonatate (TESSALON) 100 MG capsule 1 capsule as needed Patient not taking: Reported on 11/03/2023 06/14/21   [provider]  Cholecalciferol (VITAMIN D PO) Take 1,000 Units by mouth daily.     [provider]  ferrous sulfate 325 (65 FE) MG EC tablet Take 325 mg by mouth daily. Patient not taking: Reported on 11/03/2023    [provider]  fluticasone (FLONASE) 50 MCG/ACT nasal spray Place into both nostrils daily.    [provider]  hydrochlorothiazide (HYDRODIURIL) 12.5 MG tablet Take 12.5 mg by mouth every morning. 05/31/19   [provider]  polyethylene glycol (MIRALAX / GLYCOLAX) 17 g packet Take 17 g by mouth daily as needed for mild constipation.    [provider]  Polyvinyl Alcohol-Povidone PF (REFRESH) 1.4-0.6 % SOLN Apply 1 drop to eye as needed (dry eyes). Refresh    [provider]  prednisoLONE acetate (PRED FORTE) 1 % ophthalmic suspension Place 1 drop into the left eye daily.  Patient not taking: Reported on 11/03/2023 12/19/16   [provider]  timolol (TIMOPTIC) 0.5 % ophthalmic solution Place 1 drop into the left eye 2 (two) times daily. 05/14/21  [provider]    Family History Family History  Problem Relation Age of Onset   Breast cancer Sister     Social History Social History   Tobacco Use   Smoking status: Never   Smokeless tobacco: Never  Vaping Use   Vaping status: Never Used  Substance Use Topics   Alcohol use: No   Drug use: No     Allergies   Risedronate sodium   Review of Systems Review of Systems  Constitutional:  Negative for chills and fever.  HENT:  Negative for ear pain and sore throat.   Eyes:  Negative for pain and visual disturbance.  Respiratory:  Negative for cough and shortness of breath.   Cardiovascular:  Negative for chest pain and palpitations.  Gastrointestinal:  Negative for abdominal pain and vomiting.  Genitourinary:   Negative for dysuria and hematuria.  Musculoskeletal:  Negative for arthralgias and back pain.  Skin:  Negative for color change and rash.  Neurological:  Negative for seizures and syncope.  Hematological:  Positive for adenopathy (per patient, swelling under left side of chin).  All other systems reviewed and are negative.    Physical Exam Triage Vital Signs ED Triage Vitals [11/03/23 1512]  Encounter Vitals Group     BP (!) 166/85     Girls Systolic BP Percentile      Girls Diastolic BP Percentile      Boys Systolic BP Percentile      Boys Diastolic BP Percentile      Pulse Rate 64     Resp 16     Temp 98.1 F (36.7 C)     Temp Source Oral     SpO2 98 %     Weight      Height      Head Circumference      Peak Flow      Pain Score 2     Pain Loc      Pain Education      Exclude from Growth Chart    No data found.  Updated Vital Signs BP (!) 166/85 (BP Location: Left Arm)   Pulse 64   Temp 98.1 F (36.7 C) (Oral)   Resp 16   SpO2 98%   Visual Acuity Right Eye Distance:   Left Eye Distance:   Bilateral Distance:    Right Eye Near:   Left Eye Near:    Bilateral Near:     Physical Exam Vitals and nursing note reviewed.  Constitutional:      General: She is not in acute distress.    Appearance: She is well-developed.  HENT:     Head: Normocephalic and atraumatic.  Eyes:     Conjunctiva/sclera: Conjunctivae normal.  Neck:   Cardiovascular:     Rate and Rhythm: Normal rate and regular rhythm.     Heart sounds: No murmur heard. Pulmonary:     Effort: Pulmonary effort is normal. No respiratory distress.     Breath sounds: Normal breath sounds.  Abdominal:     Palpations: Abdomen is soft.     Tenderness: There is no abdominal tenderness.  Musculoskeletal:        General: No swelling.     Cervical back: Neck supple.  Skin:    General: Skin is warm and dry.     Capillary Refill: Capillary refill takes less than 2 seconds.  Neurological:     Mental  Status: She is alert.  Psychiatric:  Mood and Affect: Mood normal.      UC Treatments / Results  Labs (all labs ordered are listed, but only abnormal results are displayed) Labs Reviewed - No data to display  EKG   Radiology No results found.  Procedures Procedures (including critical care time)  Medications Ordered in UC Medications - No data to display  Initial Impression / Assessment and Plan / UC Course  I have reviewed the triage vital signs and the nursing notes.  Pertinent labs & imaging results that were available during my care of the patient were reviewed by me and considered in my medical decision making (see chart for details).     Mass of left side of neck   Mass on the left side of the neck that has been present for 3 weeks.  This area is very tender to palpitation.  Although this does appear to be something more consistent with an epidermal inclusion cyst, there is not a central pore and given its location and size we feel that imaging is likely needed prior to any intervention.  Offered to order ultrasound for the patient as an outpatient but she relates that she is in significant pain and is having some difficulty swallowing.  Given this we have recommended going to the emergency room for further evaluation and the patient is in agreement with this.  Final Clinical Impressions(s) / UC Diagnoses   Final diagnoses:  Mass of left side of neck     Discharge Instructions      Due to the pain, symptoms and location of the mass, we recommend going to the emergency room where more advanced imaging can be done.     ED Prescriptions   None    PDMP not reviewed this encounter.   Teresa Almarie LABOR, PA-C 11/03/23 1631

## 2023-11-03 NOTE — Discharge Instructions (Signed)
 Due to the pain, symptoms and location of the mass, we recommend going to the emergency room where more advanced imaging can be done.

## 2023-11-16 DIAGNOSIS — E559 Vitamin D deficiency, unspecified: Secondary | ICD-10-CM | POA: Diagnosis not present

## 2023-11-16 DIAGNOSIS — M810 Age-related osteoporosis without current pathological fracture: Secondary | ICD-10-CM | POA: Diagnosis not present

## 2023-11-16 DIAGNOSIS — I739 Peripheral vascular disease, unspecified: Secondary | ICD-10-CM | POA: Diagnosis not present

## 2023-11-16 DIAGNOSIS — Z0000 Encounter for general adult medical examination without abnormal findings: Secondary | ICD-10-CM | POA: Diagnosis not present

## 2023-11-16 DIAGNOSIS — Z1159 Encounter for screening for other viral diseases: Secondary | ICD-10-CM | POA: Diagnosis not present

## 2023-11-16 DIAGNOSIS — N183 Chronic kidney disease, stage 3 unspecified: Secondary | ICD-10-CM | POA: Diagnosis not present

## 2023-11-16 DIAGNOSIS — I10 Essential (primary) hypertension: Secondary | ICD-10-CM | POA: Diagnosis not present

## 2023-11-16 DIAGNOSIS — I129 Hypertensive chronic kidney disease with stage 1 through stage 4 chronic kidney disease, or unspecified chronic kidney disease: Secondary | ICD-10-CM | POA: Diagnosis not present

## 2023-11-16 DIAGNOSIS — K869 Disease of pancreas, unspecified: Secondary | ICD-10-CM | POA: Diagnosis not present

## 2023-11-16 DIAGNOSIS — H35033 Hypertensive retinopathy, bilateral: Secondary | ICD-10-CM | POA: Diagnosis not present

## 2023-11-16 DIAGNOSIS — Z66 Do not resuscitate: Secondary | ICD-10-CM | POA: Diagnosis not present

## 2023-11-16 DIAGNOSIS — M25561 Pain in right knee: Secondary | ICD-10-CM | POA: Diagnosis not present

## 2023-11-24 ENCOUNTER — Encounter: Payer: Self-pay | Admitting: Podiatry

## 2023-11-24 ENCOUNTER — Ambulatory Visit (INDEPENDENT_AMBULATORY_CARE_PROVIDER_SITE_OTHER): Admitting: Podiatry

## 2023-11-24 DIAGNOSIS — L84 Corns and callosities: Secondary | ICD-10-CM

## 2023-11-24 DIAGNOSIS — M79675 Pain in left toe(s): Secondary | ICD-10-CM | POA: Diagnosis not present

## 2023-11-24 DIAGNOSIS — I739 Peripheral vascular disease, unspecified: Secondary | ICD-10-CM

## 2023-11-24 DIAGNOSIS — M79674 Pain in right toe(s): Secondary | ICD-10-CM

## 2023-11-24 DIAGNOSIS — B351 Tinea unguium: Secondary | ICD-10-CM | POA: Diagnosis not present

## 2023-11-29 ENCOUNTER — Encounter: Payer: Self-pay | Admitting: Podiatry

## 2023-11-29 NOTE — Progress Notes (Signed)
  Subjective:  Patient ID: Natalya  LITTIE Ellen, female    DOB: [birth year],  MRN: [id number]  Margueritte  L Irani presents to clinic today for at risk foot care. Patient has h/o PAD and corn(s) left foot, callus(es) of both feet and painful elongated, discolored, dystrophic nails.  Pain interferes with ambulation. Aggravating factors include wearing enclosed shoe gear. Painful toenails interfere with ambulation. Aggravating factors include wearing enclosed shoe gear. Pain is relieved with periodic professional debridement. Painful corns and calluses are aggravated when weightbearing with and without shoegear. Pain is relieved with periodic professional debridement.  New problem(s): None.   PCP is Pahwani, Velna SAUNDERS, MD.  Allergies  Allergen Reactions   Risedronate Sodium     Other reaction(s): Dizziness    Review of Systems: Negative except as noted in the HPI.  Objective: No changes noted in today's physical examination. There were no vitals filed for this visit. Allesandra  L Wiedel is a pleasant 89 y.o. female WD, WN in NAD. AAO x 3.  Vascular Examination: CFT <3 seconds b/l. DP pulses faintly palpable b/l. PT pulses nonpalpable b/l. Digital hair absent. Skin temperature gradient warm to warm b/l. No pain with calf compression. No ischemia or gangrene. No cyanosis or clubbing noted b/l.    Neurological Examination: Sensation grossly intact b/l with 10 gram monofilament.   Dermatological Examination: Pedal skin warm and supple b/l. No open wounds b/l. No interdigital macerations. Toenails 1-5 b/l thick, discolored, elongated with subungual debris and pain on dorsal palpation.  Hyperkeratotic lesion(s) dorsal PIPJ of L 2nd toe and L 3rd toe, submet head 1 right foot, and plantar midfoot b/l.  No erythema, no edema, no drainage, no fluctuance.  Musculoskeletal Examination: Muscle strength 5/5 to all lower extremity muscle groups bilaterally. HAV with bunion deformity noted b/l LE. Hammertoe(s) 2-5  b/l. Pes planovalgus deformity noted b/l lower extremities.  Radiographs: None  Assessment/Plan: 1. Pain due to onychomycosis of toenails of both feet   2. Corns and callosities   3. PAD (peripheral artery disease) (HCC)     Patient was evaluated and treated. All patient's and/or POA's questions/concerns addressed on today's visit. Mycotic toenails 1-5 debrided in length and girth without incident. Corn(s)/Callus(es) dorsal PIPJ of left second digit and left third digit, submet head 1 right foot, and plantar midfoot b/l pared with sharp debridement without incident. Continue soft, supportive shoe gear daily. Report any pedal injuries to medical professional. Call office if there are any questions/concerns. -Patient/POA to call should there be question/concern in the interim.   Return in about 3 months (around 02/24/2024).  Delon LITTIE Merlin, DPM      [facility] LOCATION: [address], [place] [address]                   Office [phone]   [facility] LOCATION: [address] [place], [place] [address] Office [phone]

## 2023-12-03 ENCOUNTER — Ambulatory Visit

## 2023-12-31 ENCOUNTER — Other Ambulatory Visit: Payer: Self-pay

## 2023-12-31 ENCOUNTER — Ambulatory Visit: Attending: Internal Medicine

## 2023-12-31 DIAGNOSIS — M79671 Pain in right foot: Secondary | ICD-10-CM | POA: Diagnosis not present

## 2023-12-31 DIAGNOSIS — M6281 Muscle weakness (generalized): Secondary | ICD-10-CM | POA: Diagnosis not present

## 2023-12-31 DIAGNOSIS — R2689 Other abnormalities of gait and mobility: Secondary | ICD-10-CM | POA: Insufficient documentation

## 2023-12-31 DIAGNOSIS — G8929 Other chronic pain: Secondary | ICD-10-CM | POA: Insufficient documentation

## 2023-12-31 DIAGNOSIS — M25561 Pain in right knee: Secondary | ICD-10-CM | POA: Insufficient documentation

## 2023-12-31 NOTE — Therapy (Signed)
 OUTPATIENT PHYSICAL THERAPY LOWER EXTREMITY EVALUATION   Patient Name: [name] [name]  YOMAIRA SOLAR MRN: [id number] DOB:01/03/1932, 89 y.o., female Today's Date: 01/04/2024  END OF SESSION:   Past Medical History:  Diagnosis Date   Dry eyes    Heart murmur    Hypertension    Past Surgical History:  Procedure Laterality Date   ABDOMINAL HYSTERECTOMY     BREAST BIOPSY Right 05/20/2013   CATARACT EXTRACTION     CHOLECYSTECTOMY     EYE SURGERY     HERNIA REPAIR     TONSILLECTOMY     Patient Active Problem List   Diagnosis Date Noted   Chronic arthropathy 11/04/2021    PCP: Vernon Velna SAUNDERS, MD  REFERRING PROVIDER: Vernon Velna SAUNDERS, MD  REFERRING DIAG: [phone] (ICD-10-CM) - Right knee pain   THERAPY DIAG:  Chronic pain of right knee  Pain in right foot  Muscle weakness (generalized)  Other abnormalities of gait and mobility  Rationale for Evaluation and Treatment: Rehabilitation  ONSET DATE: Chronic  SUBJECTIVE:   SUBJECTIVE STATEMENT: Pt presents to PT with reports of chronic hx of R knee pain that has improved with wearing knee sleeve in recent weeks. Does also note chronic R foot pain that greatly increases with walking, does her knee pain. She likes to walk around the parking lot of her building a few times per week, also attends church and takes the bus. Feels pretty steady with her overall balance, wants to get stronger and decrease pain.  PERTINENT HISTORY: HTN  PAIN:  Are you having pain?  Yes: NPRS scale: 0/10 Worst: 6/10 Pain location: right knee, right foot Pain description: sharp, sore Aggravating factors: walking Relieving factors: rest, knee brace  PRECAUTIONS: None  RED FLAGS: None   WEIGHT BEARING RESTRICTIONS: No  FALLS:  Has patient fallen in last 6 months? No  LIVING ENVIRONMENT: Lives with: lives alone Lives in: House/apartment Stairs: uses elevator  Has following equipment at home: Single point cane  OCCUPATION: Retired  PLOF:  Independent  PATIENT GOALS: improve comfort with walking and be able to walk 20-30 min, decrease knee and foot pain  NEXT MD VISIT: PRN  OBJECTIVE:  Note: Objective measures were completed at Evaluation unless otherwise noted.  DIAGNOSTIC FINDINGS: see imaging  PATIENT SURVEYS:  LEFS: 29/80  COGNITION: Overall cognitive status: Within functional limits for tasks assessed     SENSATION: WFL  POSTURE: rounded shoulders and forward head  PALPATION: Slight TTP to R patellar tendon, R 1st MTP  LOWER EXTREMITY ROM:  Active ROM Right eval Left eval  Hip flexion    Hip extension    Hip abduction    Hip adduction    Hip internal rotation    Hip external rotation    Knee flexion 120 120  Knee extension 4 0  Ankle dorsiflexion    Ankle plantarflexion    Ankle inversion    Ankle eversion     (Blank rows = not tested)  LOWER EXTREMITY MMT:  MMT Right eval Left eval  Hip flexion 4 4  Hip extension    Hip abduction 3+ 3+  Hip adduction    Hip internal rotation    Hip external rotation    Knee flexion 4 4  Knee extension 4 4  Ankle dorsiflexion    Ankle plantarflexion    Ankle inversion    Ankle eversion     (Blank rows = not tested)  LOWER EXTREMITY SPECIAL TESTS:  DNT  FUNCTIONAL TESTS:  30 Second  Sit to Stand: 6 reps  GAIT: Distance walked: 44ft Assistive device utilized: Single point cane Level of assistance: Complete Independence Comments: decreased gait speed   TREATMENT: [facility] Adult PT Treatment:                                                DATE: 01/03/2024 Therapeutic Exercise: Towel scrunch x 30  Seated plantar fasica rollout x 30 with tennis ball R Supine QS x 5 - 5 hold Supine SLR x 5 ea Seated clamshell x 5 GTB  PATIENT EDUCATION:  Education details: eval findings, LEFS, HEP, POC Person educated: Patient Education method: Explanation, Demonstration, and Handouts Education comprehension: verbalized understanding and returned  demonstration  HOME EXERCISE PROGRAM: Access Code: [email or web address] URL: [email or web address] Date: 01/04/2024 Prepared by: Alm Kingdom  Exercises - Seated Toe Towel Scrunches  - 1 x daily - 7 x weekly - 2 sets - 2-3 reps - 60 sec hold - Seated Plantar Fascia Mobilization with Small Ball  - 1 x daily - 7 x weekly - 4-5 min hold - Supine Quadricep Sets  - 1 x daily - 7 x weekly - 3 sets - 10 reps - 5 sec hold - Active Straight Leg Raise with Quad Set  - 1 x daily - 7 x weekly - 2 sets - 5-10 reps - Seated Hip Abduction with Resistance  - 1 x daily - 7 x weekly - 3 sets - 10 reps - green band hold  ASSESSMENT:  CLINICAL IMPRESSION: Patient is a 89 y.o. F who was seen today for physical therapy evaluation and treatment for chronic R knee and foot pain. Physical findings are consistent with MD impression as pt demonstrates decrease in LE strength and functional mobility. Decreased R 1st MTP mobility with drift. LEFS shows subjective deficits with home ADLs and higher level mobility. Pt would benefit from skilled PT services working on improving LE strength and mobility in order to decrease pain and improve comfort.   OBJECTIVE IMPAIRMENTS: Abnormal gait, decreased activity tolerance, decreased balance, decreased mobility, difficulty walking, decreased ROM, decreased strength, and pain.   ACTIVITY LIMITATIONS: carrying, lifting, standing, squatting, stairs, transfers, and locomotion level  PARTICIPATION LIMITATIONS: meal prep, cleaning, driving, shopping, and community activity  PERSONAL FACTORS: Age, Time since onset of injury/illness/exacerbation, and 1-2 comorbidities: HTN are also affecting patient's functional outcome.   REHAB POTENTIAL: Good  CLINICAL DECISION MAKING: Stable/uncomplicated  EVALUATION COMPLEXITY: Low   GOALS: Goals reviewed with patient? No  SHORT TERM GOALS: Target date: 01/24/2024   Pt will be compliant and knowledgeable with initial HEP for improved  comfort and carryover Baseline: initial HEP given  Goal status: INITIAL  2.  Pt will self report knee and foot pain no greater than 3/10 for improved comfort and functional ability Baseline: 6/10 at worst Goal status: INITIAL   LONG TERM GOALS: Target date: 02/28/2024   Pt will improve LEFS to no less than 38/80 as proxy for functional improvement with home ADLs and higher level community activity Baseline: 29/80 Goal status: INITIAL   2.  Pt will self report right knee/foot pain no greater than 1-2/10 for improved comfort and functional ability Baseline: 6/10 at worst Goal status: INITIAL   3.  Pt will increase 30 Second Sit to Stand rep count to no less than 8 reps for improved balance, strength, and functional mobility Baseline:  6 reps  Goal status: INITIAL   4.  Pt will improve LE MMT to at least 4/5 for improved comfort and functional mobility Baseline: see chart Goal status: INITIAL  5.  Pt will improve R knee ext to 0 degrees for improved mobility and decrease R knee pain Baseline: 4 Goal status: INITIAL  6.  Pt will be able to ambulate for 20-30 minutes not limited by pain for improved comfort and functional ability with desired recreational activity Baseline:  Goal status: INITIAL   PLAN:  PT FREQUENCY: 1-2x/week  PT DURATION: 8 weeks  PLANNED INTERVENTIONS: 97164- PT Re-evaluation, 97110-Therapeutic exercises, 97530- Therapeutic activity, [id number]- Neuromuscular re-education, 97535- Self Care, [id number]- Manual therapy, [id number]- Gait training, [phone]- Electrical stimulation (unattended), [id number]- Electrical stimulation (manual), 97016- Vasopneumatic device, Patient/Family education, Cryotherapy, and Moist heat  PLAN FOR NEXT SESSION: assess HEP response, LE strengthening, foot intrinsics   Alm JAYSON Kingdom, PT 01/04/2024, 7:49 AM

## 2024-01-05 DIAGNOSIS — G4733 Obstructive sleep apnea (adult) (pediatric): Secondary | ICD-10-CM | POA: Diagnosis not present

## 2024-01-12 ENCOUNTER — Ambulatory Visit

## 2024-01-12 DIAGNOSIS — M25561 Pain in right knee: Secondary | ICD-10-CM | POA: Diagnosis not present

## 2024-01-12 DIAGNOSIS — M6281 Muscle weakness (generalized): Secondary | ICD-10-CM

## 2024-01-12 DIAGNOSIS — G8929 Other chronic pain: Secondary | ICD-10-CM

## 2024-01-12 DIAGNOSIS — M79671 Pain in right foot: Secondary | ICD-10-CM

## 2024-01-12 NOTE — Therapy (Signed)
 OUTPATIENT PHYSICAL THERAPY TREATMENT   Patient Name: [name] [name] MRN: [id number] DOB:[birth year], 89 y.o., female Today's Date: 01/12/2024  END OF SESSION:  PT End of Session - 01/12/24 1310     Visit Number 2    Number of Visits 17    Date for Recertification  02/28/24    PT Start Time 1312          Past Medical History:  Diagnosis Date   Dry eyes    Heart murmur    Hypertension    Past Surgical History:  Procedure Laterality Date   ABDOMINAL HYSTERECTOMY     BREAST BIOPSY Right 05/20/2013   CATARACT EXTRACTION     CHOLECYSTECTOMY     EYE SURGERY     HERNIA REPAIR     TONSILLECTOMY     Patient Active Problem List   Diagnosis Date Noted   Chronic arthropathy 11/04/2021    PCP: Vernon Velna SAUNDERS, MD  REFERRING PROVIDER: Vernon Velna SAUNDERS, MD  REFERRING DIAG: M25.561 (ICD-10-CM) - Right knee pain   THERAPY DIAG:  Chronic pain of right knee  Pain in right foot  Muscle weakness (generalized)  Rationale for Evaluation and Treatment: Rehabilitation  ONSET DATE: Chronic  SUBJECTIVE:   SUBJECTIVE STATEMENT: Pt presents to with no current pain in R knee, does have R foot pain at 5/10 when walking. Has been compliant with HEP.   EVAL: Pt presents to PT with reports of chronic hx of R knee pain that has improved with wearing knee sleeve in recent weeks. Does also note chronic R foot pain that greatly increases with walking, does her knee pain. She likes to walk around the parking lot of her building a few times per week, also attends church and takes the bus. Feels pretty steady with her overall balance, wants to get stronger and decrease pain.  PERTINENT HISTORY: HTN  PAIN:  Are you having pain?  Yes: NPRS scale: 0/10 Worst: 6/10 Pain location: right knee, right foot Pain description: sharp, sore Aggravating factors: walking Relieving factors: rest, knee brace  PRECAUTIONS: None  RED FLAGS: None   WEIGHT BEARING RESTRICTIONS: No  FALLS:  Has  patient fallen in last 6 months? No  LIVING ENVIRONMENT: Lives with: lives alone Lives in: House/apartment Stairs: uses elevator  Has following equipment at home: Single point cane  OCCUPATION: Retired  PLOF: Independent  PATIENT GOALS: improve comfort with walking and be able to walk 20-30 min, decrease knee and foot pain  NEXT MD VISIT: PRN  OBJECTIVE:  Note: Objective measures were completed at Evaluation unless otherwise noted.  DIAGNOSTIC FINDINGS: see imaging  PATIENT SURVEYS:  LEFS: 29/80  COGNITION: Overall cognitive status: Within functional limits for tasks assessed     SENSATION: WFL  POSTURE: rounded shoulders and forward head  PALPATION: Slight TTP to R patellar tendon, R 1st MTP  LOWER EXTREMITY ROM:  Active ROM Right eval Left eval  Hip flexion    Hip extension    Hip abduction    Hip adduction    Hip internal rotation    Hip external rotation    Knee flexion 120 120  Knee extension 4 0  Ankle dorsiflexion    Ankle plantarflexion    Ankle inversion    Ankle eversion     (Blank rows = not tested)  LOWER EXTREMITY MMT:  MMT Right eval Left eval  Hip flexion 4 4  Hip extension    Hip abduction 3+ 3+  Hip adduction  Hip internal rotation    Hip external rotation    Knee flexion 4 4  Knee extension 4 4  Ankle dorsiflexion    Ankle plantarflexion    Ankle inversion    Ankle eversion     (Blank rows = not tested)  LOWER EXTREMITY SPECIAL TESTS:  DNT  FUNCTIONAL TESTS:  30 Second Sit to Stand: 6 reps  GAIT: Distance walked: 80ft Assistive device utilized: Single point cane Level of assistance: Complete Independence Comments: decreased gait speed   TREATMENT: [facility] Adult PT Treatment:                                                DATE: 01/12/2024 Therapeutic Exercise: Seated heel-Toe raise x 15 Towel scrunch x 30  Seated plantar fasica rollout x 30 with tennis ball R Supine QS x 5 - 5 hold Supine SLR x 5 ea Seated  clamshell x 5 GTB  [facility] Adult PT Treatment:                                                DATE: 01/03/2024 Therapeutic Exercise: Towel scrunch x 30  Seated plantar fasica rollout x 30 with tennis ball R Supine QS x 5 - 5 hold Supine SLR x 5 ea Seated clamshell x 5 GTB  PATIENT EDUCATION:  Education details: eval findings, LEFS, HEP, POC Person educated: Patient Education method: Explanation, Demonstration, and Handouts Education comprehension: verbalized understanding and returned demonstration  HOME EXERCISE PROGRAM: Access Code: [email or web address] URL: [email or web address] Date: 01/04/2024 Prepared by: Alm Kingdom  Exercises - Seated Toe Towel Scrunches  - 1 x daily - 7 x weekly - 2 sets - 2-3 reps - 60 sec hold - Seated Plantar Fascia Mobilization with Small Ball  - 1 x daily - 7 x weekly - 4-5 min hold - Supine Quadricep Sets  - 1 x daily - 7 x weekly - 3 sets - 10 reps - 5 sec hold - Active Straight Leg Raise with Quad Set  - 1 x daily - 7 x weekly - 2 sets - 5-10 reps - Seated Hip Abduction with Resistance  - 1 x daily - 7 x weekly - 3 sets - 10 reps - green band hold  ASSESSMENT:  CLINICAL IMPRESSION: ***  EVAL: Patient is a 89 y.o. F who was seen today for physical therapy evaluation and treatment for chronic R knee and foot pain. Physical findings are consistent with MD impression as pt demonstrates decrease in LE strength and functional mobility. Decreased R 1st MTP mobility with drift. LEFS shows subjective deficits with home ADLs and higher level mobility. Pt would benefit from skilled PT services working on improving LE strength and mobility in order to decrease pain and improve comfort.   OBJECTIVE IMPAIRMENTS: Abnormal gait, decreased activity tolerance, decreased balance, decreased mobility, difficulty walking, decreased ROM, decreased strength, and pain.   ACTIVITY LIMITATIONS: carrying, lifting, standing, squatting, stairs, transfers, and locomotion  level  PARTICIPATION LIMITATIONS: meal prep, cleaning, driving, shopping, and community activity  PERSONAL FACTORS: Age, Time since onset of injury/illness/exacerbation, and 1-2 comorbidities: HTN are also affecting patient's functional outcome.   REHAB POTENTIAL: Good  CLINICAL DECISION MAKING: Stable/uncomplicated  EVALUATION COMPLEXITY: Low   GOALS:  Goals reviewed with patient? No  SHORT TERM GOALS: Target date: 01/24/2024   Pt will be compliant and knowledgeable with initial HEP for improved comfort and carryover Baseline: initial HEP given  Goal status: INITIAL  2.  Pt will self report knee and foot pain no greater than 3/10 for improved comfort and functional ability Baseline: 6/10 at worst Goal status: INITIAL   LONG TERM GOALS: Target date: 02/28/2024   Pt will improve LEFS to no less than 38/80 as proxy for functional improvement with home ADLs and higher level community activity Baseline: 29/80 Goal status: INITIAL   2.  Pt will self report right knee/foot pain no greater than 1-2/10 for improved comfort and functional ability Baseline: 6/10 at worst Goal status: INITIAL   3.  Pt will increase 30 Second Sit to Stand rep count to no less than 8 reps for improved balance, strength, and functional mobility Baseline: 6 reps  Goal status: INITIAL   4.  Pt will improve LE MMT to at least 4/5 for improved comfort and functional mobility Baseline: see chart Goal status: INITIAL  5.  Pt will improve R knee ext to 0 degrees for improved mobility and decrease R knee pain Baseline: 4 Goal status: INITIAL  6.  Pt will be able to ambulate for 20-30 minutes not limited by pain for improved comfort and functional ability with desired recreational activity Baseline:  Goal status: INITIAL   PLAN:  PT FREQUENCY: 1-2x/week  PT DURATION: 8 weeks  PLANNED INTERVENTIONS: 97164- PT Re-evaluation, 97110-Therapeutic exercises, 97530- Therapeutic activity, [id number]-  Neuromuscular re-education, 97535- Self Care, 02859- Manual therapy, [id number]- Gait training, [id number]- Electrical stimulation (unattended), [id number]- Electrical stimulation (manual), 97016- Vasopneumatic device, Patient/Family education, Cryotherapy, and Moist heat  PLAN FOR NEXT SESSION: assess HEP response, LE strengthening, foot intrinsics   Alm JAYSON Kingdom, PT 01/12/2024, 1:37 PM

## 2024-01-19 ENCOUNTER — Ambulatory Visit: Attending: Internal Medicine

## 2024-01-19 DIAGNOSIS — M79671 Pain in right foot: Secondary | ICD-10-CM | POA: Insufficient documentation

## 2024-01-19 DIAGNOSIS — M25561 Pain in right knee: Secondary | ICD-10-CM | POA: Diagnosis not present

## 2024-01-19 DIAGNOSIS — R2689 Other abnormalities of gait and mobility: Secondary | ICD-10-CM | POA: Diagnosis not present

## 2024-01-19 DIAGNOSIS — G8929 Other chronic pain: Secondary | ICD-10-CM | POA: Diagnosis not present

## 2024-01-19 DIAGNOSIS — M6281 Muscle weakness (generalized): Secondary | ICD-10-CM | POA: Insufficient documentation

## 2024-01-19 NOTE — Therapy (Signed)
 OUTPATIENT PHYSICAL THERAPY TREATMENT   Patient Name: [name] [name]  NISHI NEISWONGER MRN: [id number] DOB:[birth year], 89 y.o., female Today's Date: 01/20/2024  END OF SESSION:  PT End of Session - 01/19/24 1349     Visit Number 3    Number of Visits 17    Date for Recertification  02/28/24    PT Start Time 1400    PT Stop Time 1442    PT Time Calculation (min) 42 min    Activity Tolerance Patient tolerated treatment well    Behavior During Therapy WFL for tasks assessed/performed           Past Medical History:  Diagnosis Date   Dry eyes    Heart murmur    Hypertension    Past Surgical History:  Procedure Laterality Date   ABDOMINAL HYSTERECTOMY     BREAST BIOPSY Right 05/20/2013   CATARACT EXTRACTION     CHOLECYSTECTOMY     EYE SURGERY     HERNIA REPAIR     TONSILLECTOMY     Patient Active Problem List   Diagnosis Date Noted   Chronic arthropathy 11/04/2021    PCP: Vernon Velna SAUNDERS, MD  REFERRING PROVIDER: Vernon Velna SAUNDERS, MD  REFERRING DIAG: M25.561 (ICD-10-CM) - Right knee pain   THERAPY DIAG:  Chronic pain of right knee  Pain in right foot  Rationale for Evaluation and Treatment: Rehabilitation  ONSET DATE: Chronic  SUBJECTIVE:   SUBJECTIVE STATEMENT: Pt presents to PT with no R knee pain, continues to have severe bilateral foot pain at 7/10 today. Notes after STM last week that her foot pain greatly increased.   EVAL: Pt presents to PT with reports of chronic hx of R knee pain that has improved with wearing knee sleeve in recent weeks. Does also note chronic R foot pain that greatly increases with walking, does her knee pain. She likes to walk around the parking lot of her building a few times per week, also attends church and takes the bus. Feels pretty steady with her overall balance, wants to get stronger and decrease pain.  PERTINENT HISTORY: HTN  PAIN:  Are you having pain?  Yes: NPRS scale: 0/10 Worst: 6/10 Pain location: right knee, right  foot Pain description: sharp, sore Aggravating factors: walking Relieving factors: rest, knee brace  PRECAUTIONS: None  RED FLAGS: None   WEIGHT BEARING RESTRICTIONS: No  FALLS:  Has patient fallen in last 6 months? No  LIVING ENVIRONMENT: Lives with: lives alone Lives in: House/apartment Stairs: uses elevator  Has following equipment at home: Single point cane  OCCUPATION: Retired  PLOF: Independent  PATIENT GOALS: improve comfort with walking and be able to walk 20-30 min, decrease knee and foot pain  NEXT MD VISIT: PRN  OBJECTIVE:  Note: Objective measures were completed at Evaluation unless otherwise noted.  DIAGNOSTIC FINDINGS: see imaging  PATIENT SURVEYS:  LEFS: 29/80  COGNITION: Overall cognitive status: Within functional limits for tasks assessed     SENSATION: WFL  POSTURE: rounded shoulders and forward head  PALPATION: Slight TTP to R patellar tendon, R 1st MTP  LOWER EXTREMITY ROM:  Active ROM Right eval Left eval  Hip flexion    Hip extension    Hip abduction    Hip adduction    Hip internal rotation    Hip external rotation    Knee flexion 120 120  Knee extension 4 0  Ankle dorsiflexion    Ankle plantarflexion    Ankle inversion    Ankle eversion     (  Blank rows = not tested)  LOWER EXTREMITY MMT:  MMT Right eval Left eval  Hip flexion 4 4  Hip extension    Hip abduction 3+ 3+  Hip adduction    Hip internal rotation    Hip external rotation    Knee flexion 4 4  Knee extension 4 4  Ankle dorsiflexion    Ankle plantarflexion    Ankle inversion    Ankle eversion     (Blank rows = not tested)  LOWER EXTREMITY SPECIAL TESTS:  DNT  FUNCTIONAL TESTS:  30 Second Sit to Stand: 6 reps  GAIT: Distance walked: 29ft Assistive device utilized: Single point cane Level of assistance: Complete Independence Comments: decreased gait speed   TREATMENT: [facility] Adult PT Treatment:                                                 DATE: 01/19/2024 LAQ 2x10 2# Supine SLR  2x10 ea S/L clamshell 2x10 RTB Hooklying clamshell 2x15 RTB Bridge 2x10 STS x 10 no UE Standing hip abd 2x10 ea  [facility] Adult PT Treatment:                                                DATE: 01/12/2024 Seated heel-Toe raise x 15 Supine QS x 10 - 5 hold Supine SLR  2x10 ea Bridge x 5 S/L hip abd 2x10 STS 2x10 no UE Manual Therapy: STM to bilateral to toe flexors Stretching of 1st MTP  [facility] Adult PT Treatment:                                                DATE: 01/03/2024 Therapeutic Exercise: Towel scrunch x 30  Seated plantar fasica rollout x 30 with tennis ball R Supine QS x 5 - 5 hold Supine SLR x 5 ea Seated clamshell x 5 GTB  PATIENT EDUCATION:  Education details: eval findings, LEFS, HEP, POC Person educated: Patient Education method: Explanation, Demonstration, and Handouts Education comprehension: verbalized understanding and returned demonstration  HOME EXERCISE PROGRAM: Access Code: [email or web address] URL: [email or web address] Date: 01/19/2024 Prepared by: Alm Kingdom  Exercises - Seated Toe Towel Scrunches  - 1 x daily - 7 x weekly - 2 sets - 2-3 reps - 60 sec hold - Seated Plantar Fascia Mobilization with Small Ball  - 1 x daily - 7 x weekly - 4-5 min hold - Supine Quadricep Sets  - 1 x daily - 7 x weekly - 3 sets - 10 reps - 5 sec hold - Active Straight Leg Raise with Quad Set  - 1 x daily - 7 x weekly - 2 sets - 5-10 reps - Seated Hip Abduction with Resistance  - 1 x daily - 7 x weekly - 3 sets - 10 reps - green band hold - Sidelying Hip Abduction  - 1 x daily - 7 x weekly - 2 sets - 5 reps - Standing Hip Abduction with Counter Support  - 3 x weekly - 2 sets - 10 reps  ASSESSMENT:  CLINICAL IMPRESSION: Pt was able to complete all prescribed exercises with  no adverse effect. We continued to work on global LE strengthening today to improve functional mobility and activity tolerance. HEP updated for  continued lateral hip strengthening. Will continue to progress as tolerated per POC.   EVAL: Patient is a 89 y.o. F who was seen today for physical therapy evaluation and treatment for chronic R knee and foot pain. Physical findings are consistent with MD impression as pt demonstrates decrease in LE strength and functional mobility. Decreased R 1st MTP mobility with drift. LEFS shows subjective deficits with home ADLs and higher level mobility. Pt would benefit from skilled PT services working on improving LE strength and mobility in order to decrease pain and improve comfort.   OBJECTIVE IMPAIRMENTS: Abnormal gait, decreased activity tolerance, decreased balance, decreased mobility, difficulty walking, decreased ROM, decreased strength, and pain.   ACTIVITY LIMITATIONS: carrying, lifting, standing, squatting, stairs, transfers, and locomotion level  PARTICIPATION LIMITATIONS: meal prep, cleaning, driving, shopping, and community activity  PERSONAL FACTORS: Age, Time since onset of injury/illness/exacerbation, and 1-2 comorbidities: HTN are also affecting patient's functional outcome.   REHAB POTENTIAL: Good  CLINICAL DECISION MAKING: Stable/uncomplicated  EVALUATION COMPLEXITY: Low   GOALS: Goals reviewed with patient? No  SHORT TERM GOALS: Target date: 01/24/2024   Pt will be compliant and knowledgeable with initial HEP for improved comfort and carryover Baseline: initial HEP given  Goal status: INITIAL  2.  Pt will self report knee and foot pain no greater than 3/10 for improved comfort and functional ability Baseline: 6/10 at worst Goal status: INITIAL   LONG TERM GOALS: Target date: 02/28/2024   Pt will improve LEFS to no less than 38/80 as proxy for functional improvement with home ADLs and higher level community activity Baseline: 29/80 Goal status: INITIAL   2.  Pt will self report right knee/foot pain no greater than 1-2/10 for improved comfort and functional  ability Baseline: 6/10 at worst Goal status: INITIAL   3.  Pt will increase 30 Second Sit to Stand rep count to no less than 8 reps for improved balance, strength, and functional mobility Baseline: 6 reps  Goal status: INITIAL   4.  Pt will improve LE MMT to at least 4/5 for improved comfort and functional mobility Baseline: see chart Goal status: INITIAL  5.  Pt will improve R knee ext to 0 degrees for improved mobility and decrease R knee pain Baseline: 4 Goal status: INITIAL  6.  Pt will be able to ambulate for 20-30 minutes not limited by pain for improved comfort and functional ability with desired recreational activity Baseline:  Goal status: INITIAL   PLAN:  PT FREQUENCY: 1-2x/week  PT DURATION: 8 weeks  PLANNED INTERVENTIONS: 97164- PT Re-evaluation, 97110-Therapeutic exercises, 97530- Therapeutic activity, [id number]- Neuromuscular re-education, 97535- Self Care, [id number]- Manual therapy, [id number]- Gait training, [phone]- Electrical stimulation (unattended), [id number]- Electrical stimulation (manual), 97016- Vasopneumatic device, Patient/Family education, Cryotherapy, and Moist heat  PLAN FOR NEXT SESSION: assess HEP response, LE strengthening, foot intrinsics   Alm JAYSON Kingdom, PT 01/20/2024, 8:59 AM

## 2024-01-26 ENCOUNTER — Ambulatory Visit

## 2024-01-26 DIAGNOSIS — M25561 Pain in right knee: Secondary | ICD-10-CM | POA: Diagnosis not present

## 2024-01-26 DIAGNOSIS — M6281 Muscle weakness (generalized): Secondary | ICD-10-CM

## 2024-01-26 DIAGNOSIS — G8929 Other chronic pain: Secondary | ICD-10-CM

## 2024-01-26 DIAGNOSIS — M79671 Pain in right foot: Secondary | ICD-10-CM

## 2024-01-26 NOTE — Therapy (Signed)
 OUTPATIENT PHYSICAL THERAPY TREATMENT   Patient Name: [name] [name] MRN: [id number] DOB:1931-09-12, 89 y.o., female Today's Date: 01/27/2024  END OF SESSION:  PT End of Session - 01/26/24 1307     Visit Number 4    Number of Visits 17    Date for Recertification  02/28/24    PT Start Time 1315    PT Stop Time 1355    PT Time Calculation (min) 40 min    Activity Tolerance Patient tolerated treatment well    Behavior During Therapy WFL for tasks assessed/performed            Past Medical History:  Diagnosis Date   Dry eyes    Heart murmur    Hypertension    Past Surgical History:  Procedure Laterality Date   ABDOMINAL HYSTERECTOMY     BREAST BIOPSY Right 05/20/2013   CATARACT EXTRACTION     CHOLECYSTECTOMY     EYE SURGERY     HERNIA REPAIR     TONSILLECTOMY     Patient Active Problem List   Diagnosis Date Noted   Chronic arthropathy 11/04/2021    PCP: Vernon Velna SAUNDERS, MD  REFERRING PROVIDER: Vernon Velna SAUNDERS, MD  REFERRING DIAG: M25.561 (ICD-10-CM) - Right knee pain   THERAPY DIAG:  Chronic pain of right knee  Pain in right foot  Muscle weakness (generalized)  Rationale for Evaluation and Treatment: Rehabilitation  ONSET DATE: Chronic  SUBJECTIVE:   SUBJECTIVE STATEMENT: Pt presents to PT with continued foot 3/10, no current knee pain. Has continued HEP compliance with no adverse effect.   EVAL: Pt presents to PT with reports of chronic hx of R knee pain that has improved with wearing knee sleeve in recent weeks. Does also note chronic R foot pain that greatly increases with walking, does her knee pain. She likes to walk around the parking lot of her building a few times per week, also attends church and takes the bus. Feels pretty steady with her overall balance, wants to get stronger and decrease pain.  PERTINENT HISTORY: HTN  PAIN:  Are you having pain?  Yes: NPRS scale: 0/10 Worst: 6/10 Pain location: right knee, right foot Pain  description: sharp, sore Aggravating factors: walking Relieving factors: rest, knee brace  PRECAUTIONS: None  RED FLAGS: None   WEIGHT BEARING RESTRICTIONS: No  FALLS:  Has patient fallen in last 6 months? No  LIVING ENVIRONMENT: Lives with: lives alone Lives in: House/apartment Stairs: uses elevator  Has following equipment at home: Single point cane  OCCUPATION: Retired  PLOF: Independent  PATIENT GOALS: improve comfort with walking and be able to walk 20-30 min, decrease knee and foot pain  NEXT MD VISIT: PRN  OBJECTIVE:  Note: Objective measures were completed at Evaluation unless otherwise noted.  DIAGNOSTIC FINDINGS: see imaging  PATIENT SURVEYS:  LEFS: 29/80  COGNITION: Overall cognitive status: Within functional limits for tasks assessed     SENSATION: WFL  POSTURE: rounded shoulders and forward head  PALPATION: Slight TTP to R patellar tendon, R 1st MTP  LOWER EXTREMITY ROM:  Active ROM Right eval Left eval  Hip flexion    Hip extension    Hip abduction    Hip adduction    Hip internal rotation    Hip external rotation    Knee flexion 120 120  Knee extension 4 0  Ankle dorsiflexion    Ankle plantarflexion    Ankle inversion    Ankle eversion     (Blank rows =  not tested)  LOWER EXTREMITY MMT:  MMT Right eval Left eval  Hip flexion 4 4  Hip extension    Hip abduction 3+ 3+  Hip adduction    Hip internal rotation    Hip external rotation    Knee flexion 4 4  Knee extension 4 4  Ankle dorsiflexion    Ankle plantarflexion    Ankle inversion    Ankle eversion     (Blank rows = not tested)  LOWER EXTREMITY SPECIAL TESTS:  DNT  FUNCTIONAL TESTS:  30 Second Sit to Stand: 6 reps  GAIT: Distance walked: 64ft Assistive device utilized: Single point cane Level of assistance: Complete Independence Comments: decreased gait speed   TREATMENT: [facility] Adult PT Treatment:                                                DATE:  01/26/2024 Supine SLR  2x10 ea 2# SAQ 2x10 2# Bridge 2x10 Standing hip abd/ext 2x10 2# Step ups 2x5 6in step 1 UE STS x 10 no UE  [facility] Adult PT Treatment:                                                DATE: 01/19/2024 LAQ 2x10 2# Supine SLR  2x10 ea S/L clamshell 2x10 RTB Hooklying clamshell 2x15 RTB Bridge 2x10 STS x 10 no UE Standing hip abd 2x10 ea  [facility] Adult PT Treatment:                                                DATE: 01/12/2024 Seated heel-Toe raise x 15 Supine QS x 10 - 5 hold Supine SLR  2x10 ea Bridge x 5 S/L hip abd 2x10 STS 2x10 no UE Manual Therapy: STM to bilateral to toe flexors Stretching of 1st MTP  [facility] Adult PT Treatment:                                                DATE: 01/03/2024 Therapeutic Exercise: Towel scrunch x 30  Seated plantar fasica rollout x 30 with tennis ball R Supine QS x 5 - 5 hold Supine SLR x 5 ea Seated clamshell x 5 GTB  PATIENT EDUCATION:  Education details: eval findings, LEFS, HEP, POC Person educated: Patient Education method: Explanation, Demonstration, and Handouts Education comprehension: verbalized understanding and returned demonstration  HOME EXERCISE PROGRAM: Access Code: [email or web address] URL: [email or web address] Date: 01/19/2024 Prepared by: Alm Kingdom  Exercises - Seated Toe Towel Scrunches  - 1 x daily - 7 x weekly - 2 sets - 2-3 reps - 60 sec hold - Seated Plantar Fascia Mobilization with Small Ball  - 1 x daily - 7 x weekly - 4-5 min hold - Supine Quadricep Sets  - 1 x daily - 7 x weekly - 3 sets - 10 reps - 5 sec hold - Active Straight Leg Raise with Quad Set  - 1 x daily - 7 x weekly - 2 sets -  5-10 reps - Seated Hip Abduction with Resistance  - 1 x daily - 7 x weekly - 3 sets - 10 reps - green band hold - Sidelying Hip Abduction  - 1 x daily - 7 x weekly - 2 sets - 5 reps - Standing Hip Abduction with Counter Support  - 3 x weekly - 2 sets - 10 reps  ASSESSMENT:  CLINICAL  IMPRESSION: Pt was able to complete all prescribed exercises with no adverse effect. We continued to work on global LE strengthening today to improve functional mobility and activity tolerance. Will continue to progress as tolerated per POC and assess as tolerated.   EVAL: Patient is a 89 y.o. F who was seen today for physical therapy evaluation and treatment for chronic R knee and foot pain. Physical findings are consistent with MD impression as pt demonstrates decrease in LE strength and functional mobility. Decreased R 1st MTP mobility with drift. LEFS shows subjective deficits with home ADLs and higher level mobility. Pt would benefit from skilled PT services working on improving LE strength and mobility in order to decrease pain and improve comfort.   OBJECTIVE IMPAIRMENTS: Abnormal gait, decreased activity tolerance, decreased balance, decreased mobility, difficulty walking, decreased ROM, decreased strength, and pain.   ACTIVITY LIMITATIONS: carrying, lifting, standing, squatting, stairs, transfers, and locomotion level  PARTICIPATION LIMITATIONS: meal prep, cleaning, driving, shopping, and community activity  PERSONAL FACTORS: Age, Time since onset of injury/illness/exacerbation, and 1-2 comorbidities: HTN are also affecting patient's functional outcome.   REHAB POTENTIAL: Good  CLINICAL DECISION MAKING: Stable/uncomplicated  EVALUATION COMPLEXITY: Low   GOALS: Goals reviewed with patient? No  SHORT TERM GOALS: Target date: 01/24/2024   Pt will be compliant and knowledgeable with initial HEP for improved comfort and carryover Baseline: initial HEP given  Goal status: MET  2.  Pt will self report knee and foot pain no greater than 3/10 for improved comfort and functional ability Baseline: 6/10 at worst Goal status: MET   LONG TERM GOALS: Target date: 02/28/2024   Pt will improve LEFS to no less than 38/80 as proxy for functional improvement with home ADLs and higher level  community activity Baseline: 29/80 Goal status: INITIAL   2.  Pt will self report right knee/foot pain no greater than 1-2/10 for improved comfort and functional ability Baseline: 6/10 at worst Goal status: INITIAL   3.  Pt will increase 30 Second Sit to Stand rep count to no less than 8 reps for improved balance, strength, and functional mobility Baseline: 6 reps  Goal status: INITIAL   4.  Pt will improve LE MMT to at least 4/5 for improved comfort and functional mobility Baseline: see chart Goal status: INITIAL  5.  Pt will improve R knee ext to 0 degrees for improved mobility and decrease R knee pain Baseline: 4 Goal status: INITIAL  6.  Pt will be able to ambulate for 20-30 minutes not limited by pain for improved comfort and functional ability with desired recreational activity Baseline:  Goal status: INITIAL   PLAN:  PT FREQUENCY: 1-2x/week  PT DURATION: 8 weeks  PLANNED INTERVENTIONS: 97164- PT Re-evaluation, 97110-Therapeutic exercises, 97530- Therapeutic activity, [id number]- Neuromuscular re-education, 97535- Self Care, 02859- Manual therapy, [id number]- Gait training, [phone]- Electrical stimulation (unattended), [id number]- Electrical stimulation (manual), 97016- Vasopneumatic device, Patient/Family education, Cryotherapy, and Moist heat  PLAN FOR NEXT SESSION: assess HEP response, LE strengthening, foot intrinsics   Alm JAYSON Kingdom, PT 01/27/2024, 10:36 AM

## 2024-02-02 ENCOUNTER — Ambulatory Visit

## 2024-02-02 DIAGNOSIS — M79671 Pain in right foot: Secondary | ICD-10-CM

## 2024-02-02 DIAGNOSIS — G8929 Other chronic pain: Secondary | ICD-10-CM

## 2024-02-02 DIAGNOSIS — R2689 Other abnormalities of gait and mobility: Secondary | ICD-10-CM

## 2024-02-02 DIAGNOSIS — M6281 Muscle weakness (generalized): Secondary | ICD-10-CM

## 2024-02-02 DIAGNOSIS — M25561 Pain in right knee: Secondary | ICD-10-CM | POA: Diagnosis not present

## 2024-02-02 NOTE — Therapy (Signed)
 OUTPATIENT PHYSICAL THERAPY TREATMENT/DISCHARGE  PHYSICAL THERAPY DISCHARGE SUMMARY  Visits from Start of Care: 5  Current functional level related to goals / functional outcomes: See goals and objective   Remaining deficits: See goals and objective   Education / Equipment: HEP   Patient agrees to discharge. Patient goals were met. Patient is being discharged due to being pleased with the current functional level.   Patient Name: [name] [name] MRN: [id number] DOB:Sep 28, 1931, 89 y.o., female Today's Date: 02/02/2024  END OF SESSION:  PT End of Session - 02/02/24 1308     Visit Number 5    Number of Visits 17    Date for Recertification  02/28/24    PT Start Time 1315    PT Stop Time 1355    PT Time Calculation (min) 40 min    Activity Tolerance Patient tolerated treatment well    Behavior During Therapy WFL for tasks assessed/performed             Past Medical History:  Diagnosis Date   Dry eyes    Heart murmur    Hypertension    Past Surgical History:  Procedure Laterality Date   ABDOMINAL HYSTERECTOMY     BREAST BIOPSY Right 05/20/2013   CATARACT EXTRACTION     CHOLECYSTECTOMY     EYE SURGERY     HERNIA REPAIR     TONSILLECTOMY     Patient Active Problem List   Diagnosis Date Noted   Chronic arthropathy 11/04/2021    PCP: Vernon Velna SAUNDERS, MD  REFERRING PROVIDER: Vernon Velna SAUNDERS, MD  REFERRING DIAG: M25.561 (ICD-10-CM) - Right knee pain   THERAPY DIAG:  Chronic pain of right knee  Pain in right foot  Muscle weakness (generalized)  Other abnormalities of gait and mobility  Rationale for Evaluation and Treatment: Rehabilitation  ONSET DATE: Chronic  SUBJECTIVE:   SUBJECTIVE STATEMENT: Pt presents to PT with no current pain in R knee but noted some lateral knee pain a few times in previous days. Continues to have bilateral foot pain.   EVAL: Pt presents to PT with reports of chronic hx of R knee pain that has improved with  wearing knee sleeve in recent weeks. Does also note chronic R foot pain that greatly increases with walking, does her knee pain. She likes to walk around the parking lot of her building a few times per week, also attends church and takes the bus. Feels pretty steady with her overall balance, wants to get stronger and decrease pain.  PERTINENT HISTORY: HTN  PAIN:  Are you having pain?  Yes: NPRS scale: 0/10 Worst: 6/10 Pain location: right knee, right foot Pain description: sharp, sore Aggravating factors: walking Relieving factors: rest, knee brace  PRECAUTIONS: None  RED FLAGS: None   WEIGHT BEARING RESTRICTIONS: No  FALLS:  Has patient fallen in last 6 months? No  LIVING ENVIRONMENT: Lives with: lives alone Lives in: House/apartment Stairs: uses elevator  Has following equipment at home: Single point cane  OCCUPATION: Retired  PLOF: Independent  PATIENT GOALS: improve comfort with walking and be able to walk 20-30 min, decrease knee and foot pain  NEXT MD VISIT: PRN  OBJECTIVE:  Note: Objective measures were completed at Evaluation unless otherwise noted.  DIAGNOSTIC FINDINGS: see imaging  PATIENT SURVEYS:  LEFS: 29/80  COGNITION: Overall cognitive status: Within functional limits for tasks assessed     SENSATION: WFL  POSTURE: rounded shoulders and forward head  PALPATION: Slight TTP to R patellar tendon,  R 1st MTP  LOWER EXTREMITY ROM:  Active ROM Right eval Left eval Right 02/02/2024  Hip flexion     Hip extension     Hip abduction     Hip adduction     Hip internal rotation     Hip external rotation     Knee flexion 120 120   Knee extension 4 0 0  Ankle dorsiflexion     Ankle plantarflexion     Ankle inversion     Ankle eversion      (Blank rows = not tested)  LOWER EXTREMITY MMT:  MMT Right eval Left eval Right 02/02/2024 Left 10/212025  Hip flexion 4 4    Hip extension      Hip abduction 3+ 3+ 4 4  Hip adduction      Hip  internal rotation      Hip external rotation      Knee flexion 4 4    Knee extension 4 4    Ankle dorsiflexion      Ankle plantarflexion      Ankle inversion      Ankle eversion       (Blank rows = not tested)  LOWER EXTREMITY SPECIAL TESTS:  DNT  FUNCTIONAL TESTS:  30 Second Sit to Stand: 6 reps 02/02/2024: 10 reps  GAIT: Distance walked: 53ft Assistive device utilized: Single point cane Level of assistance: Complete Independence Comments: decreased gait speed   TREATMENT: [facility] Adult PT Treatment:                                                DATE: 02/02/2024 Supine QS x 10 - 5 hold Supine SLR  2x10 ea Bridge 2x10 S/L hip abd 2x10 STS 2x10 Standing hip abd/ext x 10 ea Assessment of tests/measures, goals, and outcomes for discharge  [facility] Adult PT Treatment:                                                DATE: 01/26/2024 Supine SLR  2x10 ea 2# SAQ 2x10 2# Bridge 2x10 Standing hip abd/ext 2x10 2# Step ups 2x5 6in step 1 UE STS x 10 no UE  [facility] Adult PT Treatment:                                                DATE: 01/19/2024 LAQ 2x10 2# Supine SLR  2x10 ea S/L clamshell 2x10 RTB Hooklying clamshell 2x15 RTB Bridge 2x10 STS x 10 no UE Standing hip abd 2x10 ea  [facility] Adult PT Treatment:                                                DATE: 01/12/2024 Seated heel-Toe raise x 15 Supine QS x 10 - 5 hold Supine SLR  2x10 ea Bridge x 5 S/L hip abd 2x10 STS 2x10 no UE Manual Therapy: STM to bilateral to toe flexors Stretching of 1st MTP  [facility] Adult PT Treatment:  DATE: 01/03/2024 Therapeutic Exercise: Towel scrunch x 30  Seated plantar fasica rollout x 30 with tennis ball R Supine QS x 5 - 5 hold Supine SLR x 5 ea Seated clamshell x 5 GTB  PATIENT EDUCATION:  Education details: eval findings, LEFS, HEP, POC Person educated: Patient Education method: Explanation, Demonstration, and Handouts Education  comprehension: verbalized understanding and returned demonstration  HOME EXERCISE PROGRAM: Access Code: [email or web address] URL: [email or web address] Date: 01/19/2024 Prepared by: Alm Kingdom  Exercises - Seated Toe Towel Scrunches  - 1 x daily - 7 x weekly - 2 sets - 2-3 reps - 60 sec hold - Seated Plantar Fascia Mobilization with Small Ball  - 1 x daily - 7 x weekly - 4-5 min hold - Supine Quadricep Sets  - 1 x daily - 7 x weekly - 3 sets - 10 reps - 5 sec hold - Active Straight Leg Raise with Quad Set  - 1 x daily - 7 x weekly - 2 sets - 5-10 reps - Seated Hip Abduction with Resistance  - 1 x daily - 7 x weekly - 3 sets - 10 reps - green band hold - Sidelying Hip Abduction  - 1 x daily - 7 x weekly - 2 sets - 5 reps - Standing Hip Abduction with Counter Support  - 3 x weekly - 2 sets - 10 reps  ASSESSMENT:  CLINICAL IMPRESSION: Pt was able to complete all prescribed exercises with no adverse effect and demonstrated knowledge of HEP. Over the course of PT she has progressed fairly well, meeting all LTGs and showing some subjective improvement assessed via LEFS. Should continue to improve and maintain improvement with HEP compliance. Ready to discharge at this time.   EVAL: Patient is a 89 y.o. F who was seen today for physical therapy evaluation and treatment for chronic R knee and foot pain. Physical findings are consistent with MD impression as pt demonstrates decrease in LE strength and functional mobility. Decreased R 1st MTP mobility with drift. LEFS shows subjective deficits with home ADLs and higher level mobility. Pt would benefit from skilled PT services working on improving LE strength and mobility in order to decrease pain and improve comfort.   OBJECTIVE IMPAIRMENTS: Abnormal gait, decreased activity tolerance, decreased balance, decreased mobility, difficulty walking, decreased ROM, decreased strength, and pain.   ACTIVITY LIMITATIONS: carrying, lifting, standing,  squatting, stairs, transfers, and locomotion level  PARTICIPATION LIMITATIONS: meal prep, cleaning, driving, shopping, and community activity  PERSONAL FACTORS: Age, Time since onset of injury/illness/exacerbation, and 1-2 comorbidities: HTN are also affecting patient's functional outcome.   REHAB POTENTIAL: Good  CLINICAL DECISION MAKING: Stable/uncomplicated  EVALUATION COMPLEXITY: Low   GOALS: Goals reviewed with patient? No  SHORT TERM GOALS: Target date: 01/24/2024   Pt will be compliant and knowledgeable with initial HEP for improved comfort and carryover Baseline: initial HEP given  Goal status: MET  2.  Pt will self report knee and foot pain no greater than 3/10 for improved comfort and functional ability Baseline: 6/10 at worst Goal status: MET   LONG TERM GOALS: Target date: 02/28/2024   Pt will improve LEFS to no less than 38/80 as proxy for functional improvement with home ADLs and higher level community activity Baseline: 29/80 02/02/2024: 36/80 Goal status:  MOSTLY MET  2.  Pt will self report right knee/foot pain no greater than 1-2/10 for improved comfort and functional ability Baseline: 6/10 at worst 02/02/2024: 3/10  Goal status: PARTIALLY MET   3.  Pt will  increase 30 Second Sit to Stand rep count to no less than 8 reps for improved balance, strength, and functional mobility Baseline: 6 reps  02/02/2024: 10 reps Goal status: MET   4.  Pt will improve LE MMT to at least 4/5 for improved comfort and functional mobility Baseline: see chart Goal status: MET  5.  Pt will improve R knee ext to 0 degrees for improved mobility and decrease R knee pain Baseline: 4 02/02/2024: 0 Goal status: MET  6.  Pt will be able to ambulate for 20-30 minutes not limited by pain for improved comfort and functional ability with desired recreational activity Baseline:  Goal status: MET   PLAN:  PT FREQUENCY: 1-2x/week  PT DURATION: 8 weeks  PLANNED  INTERVENTIONS: 97164- PT Re-evaluation, 97110-Therapeutic exercises, 97530- Therapeutic activity, [id number]- Neuromuscular re-education, 97535- Self Care, [id number]- Manual therapy, [id number]- Gait training, [phone]- Electrical stimulation (unattended), [id number]- Electrical stimulation (manual), 97016- Vasopneumatic device, Patient/Family education, Cryotherapy, and Moist heat  PLAN FOR NEXT SESSION: assess HEP response, LE strengthening, foot intrinsics   Alm JAYSON Kingdom, PT 02/02/2024, 2:18 PM

## 2024-02-25 DIAGNOSIS — Z23 Encounter for immunization: Secondary | ICD-10-CM | POA: Diagnosis not present

## 2024-03-08 ENCOUNTER — Ambulatory Visit: Admitting: Podiatry

## 2024-03-08 ENCOUNTER — Encounter: Payer: Self-pay | Admitting: Podiatry

## 2024-03-08 DIAGNOSIS — M79674 Pain in right toe(s): Secondary | ICD-10-CM

## 2024-03-08 DIAGNOSIS — M2141 Flat foot [pes planus] (acquired), right foot: Secondary | ICD-10-CM

## 2024-03-08 DIAGNOSIS — M79675 Pain in left toe(s): Secondary | ICD-10-CM

## 2024-03-08 DIAGNOSIS — R262 Difficulty in walking, not elsewhere classified: Secondary | ICD-10-CM

## 2024-03-08 DIAGNOSIS — B351 Tinea unguium: Secondary | ICD-10-CM

## 2024-03-08 DIAGNOSIS — L84 Corns and callosities: Secondary | ICD-10-CM

## 2024-03-08 DIAGNOSIS — M2142 Flat foot [pes planus] (acquired), left foot: Secondary | ICD-10-CM

## 2024-03-08 DIAGNOSIS — I739 Peripheral vascular disease, unspecified: Secondary | ICD-10-CM

## 2024-03-13 NOTE — Progress Notes (Signed)
 Subjective:  Patient ID: [name]  LITTIE [name], female    DOB: March 15, 1932,  MRN: [id number]  [name]  L [name] presents to clinic today for for annual diabetic foot examination, at risk foot care. Patient has h/o PAD, and callus(es) of both feet and painful mycotic toenails that are difficult to trim. Painful toenails interfere with ambulation. Aggravating factors include wearing enclosed shoe gear. Pain is relieved with periodic professional debridement. Painful calluses are aggravated when weightbearing with and without shoegear. Pain is relieved with periodic professional debridement. Patient states her feet are becoming more painful, right foot >left. Chief Complaint  Patient presents with   RFC    RFC. Not diabetic. Vernon Velna SAUNDERS, MD (PCP), 11/16/23     PCP is Pahwani, Velna SAUNDERS, MD.  Allergies  Allergen Reactions   Risedronate Sodium     Other reaction(s): Dizziness    Review of Systems: Negative except as noted in the HPI.  Objective: No changes noted in today's physical examination. There were no vitals filed for this visit. [name]  L [name] is a pleasant 89 y.o. female WD, WN in NAD. AAO x 3.  Vascular Examination: CFT <3 seconds b/l. DP pulses faintly palpable b/l. PT pulses nonpalpable b/l. Digital hair absent. Skin temperature gradient warm to warm b/l. No pain with calf compression. No ischemia or gangrene. No cyanosis or clubbing noted b/l.    Neurological Examination: Sensation grossly intact b/l with 10 gram monofilament.   Dermatological Examination: Pedal skin warm and supple b/l. No open wounds b/l. No interdigital macerations. Toenails 1-5 b/l thick, discolored, elongated with subungual debris and pain on dorsal palpation.  Hyperkeratotic lesion(s)  submet head 1 right foot, and plantar midfoot left foot, sub TN joint right foot.  No erythema, no edema, no drainage, no fluctuance.  Musculoskeletal Examination: Muscle strength 5/5 to all lower extremity muscle groups  bilaterally. HAV with bunion deformity noted b/l LE. Hammertoe(s) 2-5 b/l. Pes planovalgus deformity noted b/l lower extremities with right >left foot deformity   Radiographs: None  Assessment/Plan: 1. Pain due to onychomycosis of toenails of both feet   2. Callus   3. Pes planovalgus, acquired, left   4. Pes planovalgus, acquired, right   5. PAD (peripheral artery disease)   6. Difficulty walking due to ankle and foot joint   Consent given for treatment. Patient examined.All patient's and/or POA's questions/concerns addressed on today's visit. Toenails 1-5 b/l debrided in length and girth without incident. Callus(es) submet head 1 right foot, plantar midfoot left foot and sub TN joint right foot pared with sharp debridement without incident. Continue foot and shoe inspections daily. Monitor blood glucose per PCP/Endocrinologist's recommendations.Continue soft, supportive shoe gear daily. Report any pedal injuries to medical professional. Call office if there are any questions/concerns. -Discussed progression of foot deformity of right foot which is now more symptomatic. Recommend she be worked up for bracing.SABRAReferred to Dr. Silva for evaluation and recommendations for bracing/orthotics. -Offloaded callus(es)/porokeratotic lesion(s) submet head 1 right foot and sub TN joint right foot with felt dancers pad applied to insole(s) of shoe. -Patient/POA to call should there be question/concern in the interim.   Return in about 3 months (around 06/08/2024).  [name] [name], DPM      [facility] LOCATION: [address]  [place], [place] [address]                   Office [phone]   [facility] LOCATION: [address] [place], [place] [address] Office [phone]

## 2024-03-22 ENCOUNTER — Ambulatory Visit: Admitting: Podiatry

## 2024-03-22 DIAGNOSIS — M2142 Flat foot [pes planus] (acquired), left foot: Secondary | ICD-10-CM | POA: Diagnosis not present

## 2024-03-22 NOTE — Patient Instructions (Signed)
 Look for Dr. Heriberto gel comfort inserts and put them on top of your current insoles for additional cushioning

## 2024-03-26 NOTE — Progress Notes (Signed)
°  Subjective:  Patient ID: [name]  LITTIE [name], female    DOB: 1931/04/18,  MRN: [id number]  Chief Complaint  Patient presents with   Foot Orthotics    Rm 1 PES planovalgus; need device accomodation; New orthodics? per Dr. Daivd    89 y.o. female presents with the above complaint. History confirmed with patient. She has pain in her arch has orthotics from Dr Burt previously  Objective:  Physical Exam: warm, good capillary refill, no trophic changes or ulcerative lesions, normal DP and PT pulses, normal sensory exam, and severe pes planus deformity with collapse arch and prominence of talus/navicular and callus  Assessment:   1. Pes planovalgus, acquired, left      Plan:  Patient was evaluated and treated and all questions answered.  She returns with stable pes planus no impending ulceration she has prior CMOs with decent shell remaining I do think she would benefit from refurb and new topcover I explained our pedorthist is currently out on medical leave and we can schdule her to see us  on her return for this   Return in about 3 months (around 06/20/2024).

## 2024-06-17 ENCOUNTER — Other Ambulatory Visit

## 2024-06-21 ENCOUNTER — Ambulatory Visit: Admitting: Podiatry

## 2024-06-28 ENCOUNTER — Ambulatory Visit: Admitting: Podiatry
